# Patient Record
Sex: Male | Born: 1975
Health system: Southern US, Community
[De-identification: ages and names within clinical notes are randomized; demographics above are authoritative.]

---

## 1998-11-12 ENCOUNTER — Emergency Department (HOSPITAL_COMMUNITY): Admission: EM | Admit: 1998-11-12 | Discharge: 1998-11-12 | Payer: Self-pay | Admitting: Emergency Medicine

## 1998-11-19 ENCOUNTER — Emergency Department (HOSPITAL_COMMUNITY): Admission: EM | Admit: 1998-11-19 | Discharge: 1998-11-19 | Payer: Self-pay | Admitting: Emergency Medicine

## 2000-09-25 ENCOUNTER — Emergency Department (HOSPITAL_COMMUNITY): Admission: EM | Admit: 2000-09-25 | Discharge: 2000-09-25 | Payer: Self-pay

## 2000-12-16 ENCOUNTER — Emergency Department (HOSPITAL_COMMUNITY): Admission: EM | Admit: 2000-12-16 | Discharge: 2000-12-16 | Payer: Self-pay | Admitting: Emergency Medicine

## 2000-12-16 ENCOUNTER — Encounter: Payer: Self-pay | Admitting: Emergency Medicine

## 2001-01-08 ENCOUNTER — Encounter: Admission: RE | Admit: 2001-01-08 | Discharge: 2001-01-08 | Payer: Self-pay | Admitting: Sports Medicine

## 2001-02-26 ENCOUNTER — Encounter: Admission: RE | Admit: 2001-02-26 | Discharge: 2001-02-26 | Payer: Self-pay | Admitting: Family Medicine

## 2001-03-06 ENCOUNTER — Encounter: Admission: RE | Admit: 2001-03-06 | Discharge: 2001-03-06 | Payer: Self-pay | Admitting: Family Medicine

## 2001-03-07 ENCOUNTER — Encounter: Payer: Self-pay | Admitting: *Deleted

## 2001-03-07 ENCOUNTER — Encounter: Admission: RE | Admit: 2001-03-07 | Discharge: 2001-03-07 | Payer: Self-pay | Admitting: *Deleted

## 2001-03-09 ENCOUNTER — Encounter: Admission: RE | Admit: 2001-03-09 | Discharge: 2001-03-09 | Payer: Self-pay | Admitting: Sports Medicine

## 2002-06-03 ENCOUNTER — Encounter: Admission: RE | Admit: 2002-06-03 | Discharge: 2002-06-03 | Payer: Self-pay | Admitting: Family Medicine

## 2003-02-23 ENCOUNTER — Encounter: Payer: Self-pay | Admitting: Emergency Medicine

## 2003-02-23 ENCOUNTER — Emergency Department (HOSPITAL_COMMUNITY): Admission: AD | Admit: 2003-02-23 | Discharge: 2003-02-23 | Payer: Self-pay | Admitting: Emergency Medicine

## 2004-08-09 ENCOUNTER — Ambulatory Visit: Payer: Self-pay | Admitting: Sports Medicine

## 2004-08-09 ENCOUNTER — Ambulatory Visit (HOSPITAL_COMMUNITY): Admission: RE | Admit: 2004-08-09 | Discharge: 2004-08-09 | Payer: Self-pay | Admitting: Family Medicine

## 2006-07-24 ENCOUNTER — Ambulatory Visit: Payer: Self-pay | Admitting: Pulmonary Disease

## 2006-08-31 DIAGNOSIS — M928 Other specified juvenile osteochondrosis: Secondary | ICD-10-CM

## 2006-08-31 DIAGNOSIS — I471 Supraventricular tachycardia: Secondary | ICD-10-CM

## 2009-06-12 HISTORY — PX: VASECTOMY: SHX75

## 2009-07-04 HISTORY — PX: KNEE ARTHROSCOPY: SUR90

## 2009-07-27 ENCOUNTER — Encounter: Admission: RE | Admit: 2009-07-27 | Discharge: 2009-07-27 | Payer: Self-pay | Admitting: Family Medicine

## 2009-08-19 ENCOUNTER — Encounter: Admission: RE | Admit: 2009-08-19 | Discharge: 2009-08-19 | Payer: Self-pay | Admitting: Family Medicine

## 2010-07-14 ENCOUNTER — Encounter
Admission: RE | Admit: 2010-07-14 | Discharge: 2010-07-14 | Payer: Self-pay | Source: Home / Self Care | Attending: Family Medicine | Admitting: Family Medicine

## 2010-09-20 ENCOUNTER — Other Ambulatory Visit: Payer: Self-pay | Admitting: Family Medicine

## 2010-09-20 DIAGNOSIS — R634 Abnormal weight loss: Secondary | ICD-10-CM

## 2010-09-21 ENCOUNTER — Ambulatory Visit
Admission: RE | Admit: 2010-09-21 | Discharge: 2010-09-21 | Disposition: A | Discharge status: Home / Self Care | Payer: 59 | Source: Ambulatory Visit | Attending: Family Medicine | Admitting: Family Medicine

## 2010-09-21 DIAGNOSIS — R634 Abnormal weight loss: Secondary | ICD-10-CM

## 2010-09-21 MED ORDER — IOHEXOL 300 MG/ML  SOLN
100.0000 mL | Freq: Once | INTRAMUSCULAR | Status: AC | PRN
  Administered 2010-09-21: 100 mL via INTRAVENOUS

## 2010-09-26 ENCOUNTER — Emergency Department (HOSPITAL_COMMUNITY)
Admission: EM | Admit: 2010-09-26 | Discharge: 2010-09-27 | Disposition: A | Discharge status: Home / Self Care | Payer: 59 | Attending: Emergency Medicine | Admitting: Emergency Medicine

## 2010-09-26 ENCOUNTER — Emergency Department (HOSPITAL_COMMUNITY): Payer: 59

## 2010-09-26 DIAGNOSIS — R5381 Other malaise: Secondary | ICD-10-CM | POA: Insufficient documentation

## 2010-09-26 DIAGNOSIS — R918 Other nonspecific abnormal finding of lung field: Secondary | ICD-10-CM | POA: Insufficient documentation

## 2010-09-26 DIAGNOSIS — N390 Urinary tract infection, site not specified: Secondary | ICD-10-CM | POA: Insufficient documentation

## 2010-09-26 LAB — POCT I-STAT, CHEM 8
Glucose, Bld: 96 mg/dL (ref 70–99)
HCT: 38 % — ABNORMAL LOW (ref 39.0–52.0)
Hemoglobin: 12.9 g/dL — ABNORMAL LOW (ref 13.0–17.0)
Potassium: 4 mEq/L (ref 3.5–5.1)
Sodium: 137 mEq/L (ref 135–145)

## 2010-09-26 LAB — GLUCOSE, CAPILLARY: Glucose-Capillary: 88 mg/dL (ref 70–99)

## 2010-09-27 ENCOUNTER — Telehealth: Payer: Self-pay

## 2010-09-27 ENCOUNTER — Encounter (HOSPITAL_COMMUNITY): Payer: Self-pay

## 2010-09-27 ENCOUNTER — Emergency Department (HOSPITAL_COMMUNITY): Payer: 59

## 2010-09-27 ENCOUNTER — Telehealth: Payer: Self-pay | Admitting: Internal Medicine

## 2010-09-27 LAB — URINE MICROSCOPIC-ADD ON

## 2010-09-27 LAB — DIFFERENTIAL
Basophils Absolute: 0 10*3/uL (ref 0.0–0.1)
Basophils Relative: 1 % (ref 0–1)
Eosinophils Relative: 2 % (ref 0–5)
Lymphocytes Relative: 22 % (ref 12–46)
Neutro Abs: 4.8 10*3/uL (ref 1.7–7.7)

## 2010-09-27 LAB — CBC
HCT: 36.9 % — ABNORMAL LOW (ref 39.0–52.0)
RDW: 13.4 % (ref 11.5–15.5)
WBC: 7.4 10*3/uL (ref 4.0–10.5)

## 2010-09-27 LAB — URINALYSIS, ROUTINE W REFLEX MICROSCOPIC
Hgb urine dipstick: NEGATIVE
Nitrite: NEGATIVE
Specific Gravity, Urine: 1.009 (ref 1.005–1.030)
Urobilinogen, UA: 0.2 mg/dL (ref 0.0–1.0)

## 2010-09-27 MED ORDER — IOHEXOL 300 MG/ML  SOLN
100.0000 mL | Freq: Once | INTRAMUSCULAR | Status: AC | PRN
  Administered 2010-09-27: 100 mL via INTRAVENOUS

## 2010-09-27 NOTE — Telephone Encounter (Signed)
ERROR DUPLICATE MESSAGE.Vedia Coffer

## 2010-09-27 NOTE — Telephone Encounter (Signed)
Schedule pt to see MW on 10-14-10 for sarcoidosis. Carron Curie, CMA

## 2010-09-28 ENCOUNTER — Telehealth: Payer: Self-pay | Admitting: Pulmonary Disease

## 2010-09-28 NOTE — Telephone Encounter (Signed)
Called and spoke with Dr. Ermalene Searing office and informed her per td dr. Laury Axon to call and requests to speak to one of our physcians to get worked in since we don' have any openings.   Carver Fila, CMA

## 2010-09-29 ENCOUNTER — Encounter: Payer: Self-pay | Admitting: Pulmonary Disease

## 2010-09-29 ENCOUNTER — Other Ambulatory Visit: Payer: 59

## 2010-09-29 ENCOUNTER — Ambulatory Visit (INDEPENDENT_AMBULATORY_CARE_PROVIDER_SITE_OTHER): Payer: 59 | Admitting: Pulmonary Disease

## 2010-09-29 VITALS — BP 120/88 | HR 87 | Temp 98.5°F | Ht 70.0 in | Wt 147.4 lb

## 2010-09-29 DIAGNOSIS — R590 Localized enlarged lymph nodes: Secondary | ICD-10-CM

## 2010-09-29 DIAGNOSIS — R599 Enlarged lymph nodes, unspecified: Secondary | ICD-10-CM

## 2010-09-29 NOTE — Progress Notes (Signed)
  Subjective:    Patient ID: Ryan Horton, male    DOB: 1976/07/01, 35 y.o.   MRN: [id number]  HPI 35 year old male who presents with a chief complaint of weakness. for 1.5 months---nothing makes sx better or Worse,  Symptoms statrted with Ankle swelling in nov '11 Back pain - more steroids, nerve pill >> saw ortho >> aleve, took off steroids Then dry mouth in feb,c/o redness of eyes, loss of appetite, 'sick'- flu like weakness towards evening >> blood work s/o infection, ? wc high CT abdomen 3/20 >> neg GI appt made for bowel issue ER visit on 3/26 >> CXR showed bihilar fullness  CT chest showed >>Bilateral hilar and mediastinal adenopathy.  Slightly nodular appearing infiltrate identified in the left upper lobe, question atypical process such as fungal disease.  Given cipro for UTI Lost 16-20 lbs in 2 weeks Reviewed labs >> ANA neg, ESR 20, RA neg He was seen in jan'2008 for asthma like illness attributed to viral bronchitis - Spirometry nml then      Review of Systems  Constitutional: Positive for fever, appetite change and unexpected weight change.  HENT: Negative for ear pain, congestion, sore throat, rhinorrhea, sneezing, trouble swallowing, dental problem and postnasal drip.   Eyes: Positive for redness.  Respiratory: Positive for cough and shortness of breath. Negative for wheezing.   Cardiovascular: Positive for leg swelling. Negative for chest pain and palpitations.  Gastrointestinal: Positive for nausea and abdominal pain. Negative for vomiting and diarrhea.  Genitourinary: Positive for frequency. Negative for dysuria and urgency.  Musculoskeletal: Negative for joint swelling.  Skin: Negative for rash.  Neurological: Positive for headaches. Negative for syncope.  Hematological: Does not bruise/bleed easily.  Psychiatric/Behavioral: Negative for dysphoric mood. The patient is not nervous/anxious.        Objective:   Physical Exam Gen. Pleasant, well-nourished, in  no distress, normal affect ENT - no lesions, no post nasal drip Neck: No JVD, no thyromegaly, no carotid bruits Lungs: no use of accessory muscles, no dullness to percussion, clear without rales or rhonchi  Cardiovascular: Rhythm regular, heart sounds  normal, no murmurs or gallops, no peripheral edema Abdomen: soft and non-tender, no hepatosplenomegaly, BS normal. Musculoskeletal: No deformities, no cyanosis or clubbing Neuro:  alert, non focal         Assessment & Plan:

## 2010-09-29 NOTE — Patient Instructions (Signed)
We discussed possibilities of lymph node enlargement Bronchoscopy arranged for 3/30 Friday at 0800 at [facility] - go to admitting Nothing to eat after midnight  Blood work

## 2010-09-30 NOTE — Assessment & Plan Note (Signed)
Reactive vs other etiologies including sarcoidosis, lymphoma and metastatic disease - sarcoidosis would be a unifying diagnosis given some arthritis & redness of eyes. Also note micronodular parenchymal involvement on imaging The various options of biopsy including bronchoscopy,endobronchial US  guided needle aspiration and surgical biopsy were discussed.The risks of each procedure including coughing, bleeding and the  chances of lung puncture requiring chest tube were discussed in great detail. The benefits & alternatives including serial follow up were also discussed. We will proceed with bronchoscopy & transbronchial biopsies of LUL - the yield of 70-80% for sarcoid was discussed Obtain ACE level, If needed -will place PPD to establish baseline

## 2010-10-01 ENCOUNTER — Ambulatory Visit (HOSPITAL_COMMUNITY)
Admission: RE | Admit: 2010-10-01 | Discharge: 2010-10-01 | Disposition: A | Discharge status: Home / Self Care | Payer: 59 | Source: Ambulatory Visit | Attending: Pulmonary Disease | Admitting: Pulmonary Disease

## 2010-10-01 ENCOUNTER — Other Ambulatory Visit: Payer: Self-pay | Admitting: Pulmonary Disease

## 2010-10-01 DIAGNOSIS — R590 Localized enlarged lymph nodes: Secondary | ICD-10-CM

## 2010-10-01 DIAGNOSIS — R599 Enlarged lymph nodes, unspecified: Secondary | ICD-10-CM | POA: Insufficient documentation

## 2010-10-01 DIAGNOSIS — R918 Other nonspecific abnormal finding of lung field: Secondary | ICD-10-CM

## 2010-10-01 DIAGNOSIS — R634 Abnormal weight loss: Secondary | ICD-10-CM | POA: Insufficient documentation

## 2010-10-01 DIAGNOSIS — R000 Tachycardia, unspecified: Secondary | ICD-10-CM | POA: Insufficient documentation

## 2010-10-01 DIAGNOSIS — M129 Arthropathy, unspecified: Secondary | ICD-10-CM | POA: Insufficient documentation

## 2010-10-05 ENCOUNTER — Encounter: Payer: Self-pay | Admitting: Pulmonary Disease

## 2010-10-07 ENCOUNTER — Encounter: Payer: Self-pay | Admitting: Pulmonary Disease

## 2010-10-07 ENCOUNTER — Ambulatory Visit (INDEPENDENT_AMBULATORY_CARE_PROVIDER_SITE_OTHER): Payer: 59 | Admitting: Pulmonary Disease

## 2010-10-07 VITALS — BP 128/80 | HR 92 | Temp 98.1°F | Ht 69.0 in | Wt 147.2 lb

## 2010-10-07 DIAGNOSIS — D869 Sarcoidosis, unspecified: Secondary | ICD-10-CM

## 2010-10-07 LAB — LEGIONELLA PROFILE(CULTURE+DFA/SMEAR): Legionella Antigen (DFA): NEGATIVE

## 2010-10-07 MED ORDER — PREDNISONE 10 MG PO TABS: 20.0000 mg | ORAL_TABLET | Freq: Every day | ORAL | Status: DC

## 2010-10-07 NOTE — Progress Notes (Signed)
  Subjective:    Patient ID: ARMONI DEPASS, male    DOB: [birth year], 35 y.o.   MRN: [id number]  HPI 35 year old male who presents with a chief complaint of weakness. for 1.5 months---nothing makes sx better or Worse,  Symptoms statrted with Ankle swelling in nov '11 Back pain - more steroids, nerve pill >> saw ortho >> aleve, took off steroids Then dry mouth in feb,c/o redness of eyes, loss of appetite, 'sick'- flu like weakness towards evening >> blood work s/o infection, ? wc high CT abdomen 3/20 >> neg GI appt made for bowel issue ER visit on 3/26 >> CXR showed bihilar fullness  CT chest showed >>Bilateral hilar and mediastinal adenopathy.  Slightly nodular appearing infiltrate identified in the left upper lobe, question atypical process such as fungal disease.  Given cipro for UTI Lost 16-20 lbs in 2 weeks Reviewed labs >> ANA neg, ESR 20, RA neg He was seen in jan'2008 for asthma like illness attributed to viral bronchitis - Spirometry nml then    Review of Systems     Objective:   Physical Exam        Assessment & Plan:

## 2010-10-07 NOTE — Assessment & Plan Note (Signed)
NOn caseating granulomas in the setting of brief arthritis,  mediastinal LNs, micronodular LUL infiltrate & granular appearance of airways is diagnostic of sarcoidosis Eye exam Baseline EKG -ok Ca level ok Start on 20 mg prednisone x 4 weeks , then slow taper over 3-6 months to lowest effective dose or off. Baseline ACE level

## 2010-10-07 NOTE — Patient Instructions (Addendum)
EKG today ACE level Eye exam Start prednisone 20 mg - we discussed side effects Full pFTs

## 2010-10-14 ENCOUNTER — Institutional Professional Consult (permissible substitution): Payer: 59 | Admitting: Internal Medicine

## 2010-10-18 ENCOUNTER — Ambulatory Visit (INDEPENDENT_AMBULATORY_CARE_PROVIDER_SITE_OTHER): Payer: 59 | Admitting: Pulmonary Disease

## 2010-10-18 DIAGNOSIS — D869 Sarcoidosis, unspecified: Secondary | ICD-10-CM

## 2010-10-18 LAB — PULMONARY FUNCTION TEST

## 2010-10-18 NOTE — Progress Notes (Signed)
PFT done today. 

## 2010-10-18 NOTE — Op Note (Signed)
  [name], Ryan Horton              ACCOUNT NO.:  [id number]  MEDICAL RECORD NO.:  [id number]           PATIENT TYPE:  O  LOCATION:  RESP                         FACILITY:  [facility]  PHYSICIAN:  Oretha Milch, MD      DATE OF BIRTH:  07-16-1975  DATE OF PROCEDURE:  10/01/2010 DATE OF DISCHARGE:                              OPERATIVE REPORT   PROCEDURE PERFORMED:  Video bronchoscopy with biopsies.  INDICATIONS FOR PROCEDURE:  Mediastinal lymphadenopathy with left upper lobe micronodular infiltrate in this 36 year old nonsmoker with preceding arthritis, weight loss, very suggestive of sarcoidosis. Written informed consent was obtained from the patient prior to procedure.  Risks of the procedure including coughing, bleeding, and a small chance of lung puncture requiring a chest tube were discussed in detail and he evidenced understanding.  DESCRIPTION OF PROCEDURE:  Versed 6 mg and 150 mcg of fentanyl were used in divided doses over a span of 20-30 minutes during the procedure. Bronchoscope was inserted from the right naris.  Upper airway appeared normal.  Vocal cords showed normal appearance and motion.  The tracheobronchial tree was then examined.  At the subsegmental level, the mucosa appeared nodular (see pictures).  No endobronchial lesions were noted.  Minimal phlegm was noted in the right lower lobe.  Attention was then turned to the left upper lobe.  Bronchoalveolar lavage was obtained from various subsegments of the left upper lobe with moderate amount of coughing.  Transbronchial biopsies x3 to 4 were obtained from apical posterior and anterior subsegments of the left upper lobe.  There was bleeding with blood loss of about 5-10 cc in the last biopsies. Hemostasis was achieved by pressure with the tip of the scope and suctioning was carried out until no further bleeding was noted.  The patient tolerated the procedure well except for tachycardia 130s. Bronchoscope was then  withdrawn.  He was awake right after procedure. Chest x-ray will be performed to rule out presence of pneumothorax.     Oretha Milch, MD     RVA/MEDQ  D:  10/01/2010  T:  10/01/2010  Job:  [id number]  Electronically Signed by Cyril Mourning MD on 10/18/2010 08:37:29 AM

## 2010-10-19 ENCOUNTER — Encounter: Payer: Self-pay | Admitting: Pulmonary Disease

## 2010-10-25 ENCOUNTER — Telehealth: Payer: Self-pay | Admitting: Pulmonary Disease

## 2010-10-25 NOTE — Telephone Encounter (Signed)
Let him know PFTs show mild decrease in lung capacity Hope he is doing well on prednisone

## 2010-10-26 ENCOUNTER — Institutional Professional Consult (permissible substitution): Payer: 59 | Admitting: Pulmonary Disease

## 2010-10-27 ENCOUNTER — Ambulatory Visit (INDEPENDENT_AMBULATORY_CARE_PROVIDER_SITE_OTHER): Payer: 59 | Admitting: Internal Medicine

## 2010-10-27 ENCOUNTER — Encounter: Payer: Self-pay | Admitting: Internal Medicine

## 2010-10-27 ENCOUNTER — Other Ambulatory Visit (INDEPENDENT_AMBULATORY_CARE_PROVIDER_SITE_OTHER): Payer: 59

## 2010-10-27 ENCOUNTER — Other Ambulatory Visit (INDEPENDENT_AMBULATORY_CARE_PROVIDER_SITE_OTHER): Payer: 59 | Admitting: Internal Medicine

## 2010-10-27 ENCOUNTER — Encounter: Payer: Self-pay | Admitting: Pulmonary Disease

## 2010-10-27 VITALS — BP 122/88 | HR 88 | Temp 98.5°F | Resp 16 | Ht 69.0 in | Wt 150.0 lb

## 2010-10-27 DIAGNOSIS — Z13220 Encounter for screening for lipoid disorders: Secondary | ICD-10-CM

## 2010-10-27 DIAGNOSIS — D649 Anemia, unspecified: Secondary | ICD-10-CM

## 2010-10-27 DIAGNOSIS — Z0000 Encounter for general adult medical examination without abnormal findings: Secondary | ICD-10-CM | POA: Insufficient documentation

## 2010-10-27 DIAGNOSIS — D869 Sarcoidosis, unspecified: Secondary | ICD-10-CM

## 2010-10-27 LAB — CBC WITH DIFFERENTIAL/PLATELET
Basophils Relative: 0.1 % (ref 0.0–3.0)
Eosinophils Absolute: 0 10*3/uL (ref 0.0–0.7)
Eosinophils Relative: 0.1 % (ref 0.0–5.0)
Hemoglobin: 13.1 g/dL (ref 13.0–17.0)
Lymphocytes Relative: 6.8 % — ABNORMAL LOW (ref 12.0–46.0)
MCHC: 33.3 g/dL (ref 30.0–36.0)
MCV: 84.7 fl (ref 78.0–100.0)
Monocytes Absolute: 1 10*3/uL (ref 0.1–1.0)
Neutro Abs: 7.3 10*3/uL (ref 1.4–7.7)
RBC: 4.66 Mil/uL (ref 4.22–5.81)
WBC: 9 10*3/uL (ref 4.5–10.5)

## 2010-10-27 NOTE — Progress Notes (Signed)
Subjective:    Patient ID: Ryan Horton, male    DOB: 1975/08/26, 35 y.o.   MRN: [id number]  Anemia Presents for follow-up visit. There has been no abdominal pain, anorexia, bruising/bleeding easily, confusion, fever, leg swelling, light-headedness, malaise/fatigue, pallor, palpitations, paresthesias, pica or weight loss. Signs of blood loss that are not present include hematemesis, hematochezia, melena, menorrhagia and vaginal bleeding. There are no compliance problems.       Review of Systems  Constitutional: Negative for fever, chills, weight loss, malaise/fatigue, diaphoresis, activity change, appetite change, fatigue and unexpected weight change.  HENT: Negative for facial swelling, neck pain and neck stiffness.   Respiratory: Negative for apnea, cough, choking, chest tightness, shortness of breath, wheezing and stridor.   Cardiovascular: Negative for chest pain, palpitations and leg swelling.  Gastrointestinal: Negative for nausea, vomiting, abdominal pain, diarrhea, constipation, blood in stool, melena, hematochezia, abdominal distention, anal bleeding, rectal pain, anorexia and hematemesis.  Genitourinary: Negative for dysuria, urgency, hematuria, flank pain, decreased urine volume, scrotal swelling, vaginal bleeding, enuresis, testicular pain and menorrhagia.  Musculoskeletal: Negative for myalgias, back pain, joint swelling, arthralgias and gait problem.  Skin: Negative for pallor and rash.  Neurological: Negative for dizziness, tremors, seizures, syncope, facial asymmetry, speech difficulty, weakness, light-headedness, numbness, headaches and paresthesias.  Hematological: Negative for adenopathy. Does not bruise/bleed easily.  Psychiatric/Behavioral: Negative for behavioral problems, confusion, dysphoric mood, decreased concentration and agitation. The patient is not nervous/anxious.        Objective:   Physical Exam  [vitalsreviewed. Constitutional: He is oriented to  person, place, and time. He appears well-developed and well-nourished. No distress.  HENT:  Head: Normocephalic and atraumatic.  Right Ear: External ear normal.  Left Ear: External ear normal.  Nose: Nose normal.  Mouth/Throat: Oropharynx is clear and moist. No oropharyngeal exudate.  Eyes: Conjunctivae and EOM are normal. Pupils are equal, round, and reactive to light. Right eye exhibits no discharge. Left eye exhibits no discharge. No scleral icterus.  Neck: Normal range of motion. Neck supple. No JVD present. No tracheal deviation present. No thyromegaly present.  Cardiovascular: Normal rate, regular rhythm, normal heart sounds and intact distal pulses.  Exam reveals no gallop and no friction rub.   No murmur heard. Pulmonary/Chest: Effort normal and breath sounds normal. No stridor. No respiratory distress. He has no wheezes. He has no rales. He exhibits no tenderness.  Abdominal: Soft. Bowel sounds are normal. He exhibits no distension and no mass. There is no tenderness. There is no rebound and no guarding. Hernia confirmed negative in the right inguinal area and confirmed negative in the left inguinal area.  Genitourinary: Rectum normal, testes normal and penis normal. Rectal exam shows no external hemorrhoid, no internal hemorrhoid, no fissure, no mass, no tenderness and anal tone normal. Guaiac negative stool. Right testis shows no mass, no swelling and no tenderness. Left testis shows no mass, no swelling and no tenderness. Uncircumcised. No phimosis, paraphimosis, hypospadias, penile erythema or penile tenderness. No discharge found.  Musculoskeletal: Normal range of motion. He exhibits no edema and no tenderness.  Lymphadenopathy:    He has no cervical adenopathy.       Right: No inguinal adenopathy present.       Left: No inguinal adenopathy present.  Neurological: He is alert and oriented to person, place, and time. He has normal reflexes. He displays normal reflexes. No cranial  nerve deficit. He exhibits normal muscle tone. Coordination normal.  Skin: Skin is warm and dry. No rash noted. He  is not diaphoretic. No erythema. No pallor.  Psychiatric: He has a normal mood and affect. His behavior is normal. Judgment and thought content normal.        Lab Results  Component Value Date   WBC 7.4 09/27/2010   HGB 12.0* 09/27/2010   HCT 36.9* 09/27/2010   PLT 274 09/27/2010   NA 137 09/26/2010   K 4.0 09/26/2010   CL 101 09/26/2010   CREATININE 1.3 09/26/2010   BUN 17 09/26/2010    Assessment & Plan:

## 2010-10-27 NOTE — Assessment & Plan Note (Signed)
Pt ed material given, labs ordered, will follow

## 2010-10-27 NOTE — Assessment & Plan Note (Signed)
I think the anemia is due to chronic disease but will check for iron and B12 deficiency

## 2010-10-27 NOTE — Patient Instructions (Signed)
Anemia - Nonspecific Your exam and blood tests show you are anemic. This means your blood (hemoglobin) level is low. Normal hemoglobin values are 12-15 for females and 14-17 for males. Make a note of your hemoglobin level today. The hematocrit percent is also used to measure anemia. A normal hematocrit is 38-46 in females and 42-49 in males. Make a note of your hematocrit level today. SYMPTOMS Anemia can come on suddenly (acute). It can also come on slowly (chronic). Symptoms can include:  Minor weakness.   Dizziness.   Palpitations.  Shortness of breath.   Symptoms may be absent until half your hemoglobin is missing if it comes on slowly. Anemia due to acute blood loss from an injury or internal bleeding may require blood transfusion if the loss is severe. Hospital care is needed if you are anemic and there is significant continued blood loss. CAUSES Anemia can be due to many different causes.  Excessive bleeding from periods is a common problem in women.  Other causes can include:  Intestinal bleeding.   Poor nutrition.   Kidney, thyroid, liver, and bone marrow diseases.  TREATMENT  Stool tests for blood (Hemoccult) and additional lab tests are often needed. This determines the best treatment.   Further checking on your condition and your response to treatment is very important. It often takes many weeks to correct anemia.  Depending on the cause, treatment can include:  Supplements of iron.   Vitamins B12 and folic acid.   Hormone medicines.  If your anemia is due to bleeding, finding the cause of the blood loss is very important. This will help avoid further problems. SEEK IMMEDIATE MEDICAL CARE IF:  You develop fainting, extreme weakness, shortness of breath, or chest pain.   You develop heavy vaginal bleeding.   You develop bloody or black, tarry stools or vomit up blood.   You develop a high fever, rash, repeated vomiting, or dehydration.  Document Released:  07/28/2004 Document Re-Released: 12/08/2009 [facility] Patient Information 2011 [place], [place].Health Maintenance in Males MAINTAIN REGULAR HEALTH EXAMS  Maintain a healthy diet and normal weight. Increased weight leads to problems with blood pressure and diabetes. Decrease fat in the diet and increase exercise. Obtain a proper diet from your caregiver if necessary.   High blood pressure causes heart and blood vessel problems. Check blood pressures regularly and keep your blood pressure at normal limits. Aerobic exercise helps this. Persistent elevations of blood pressure should be treated with medications if weight loss and exercise are ineffective.   Avoid smoking, drinking in excess (more than 2 drinks per day), or use of street drugs. Do not share needles with anyone. Ask for help if you need assistance or instructions on stopping the use of alcohol, cigarettes, or drugs.   Maintain normal blood lipids and cholesterol. Your caregiver can give you information to lower your risk of heart disease or stroke.   Ask your caregiver if you are in need of early heart disease screening because of a strong family history of heart disease or signs of elevated testosterone (male sex hormone) levels. These can predispose you to early heart disease.   Practice safe sex. Practicing safe sex decreases your risk for a sexually transmitted infection (STI). Some of the STIs are gonorrhea, chlamydia, syphilis, trichimonas, herpes, human papillomavirus (HPV), and human immunodeficiency virus (HIV). Herpes, HIV, and HPV are viral illnesses that have no cure. These can result in disability, cancer, and death.   It is not safe for someone who has  AIDS or is HIV positive to have unprotected sex with a partner who is HIV positive. The reason for this is the fact that there are many different strains of HIV. If you have a strain that is readily treated with medications and then suddenly introduce a strain from a partner that  has no further treatment options, you may suddenly have a strain of HIV that is untreatable. Even if you are both positive for HIV, it is still necessary to practice safe sex.   Use sunscreen with a SPF of 15 or greater. Being outside in the sun when your shadow caused by the sun is shorter than you are, means you are being exposed to sun at greater intensity. Lighter skinned people are at a greater risk of skin cancer.   Keep carbon monoxide and smoke detectors in your home and functioning at all times. Change the batteries every 6 months.   Do monthly examinations of your testicles. The best time to do this is after a hot shower or bath when the tissues are loose. Notify your caregivers of any lumps, tenderness, or changes in size or shape.   Notify your caregiver of new moles or changes in moles, especially if there is a change in shape or color. Also notify your caregiver if a mole is larger than the size of a pencil eraser.   Stay current with your tetanus shots and other required immunizations.  The Body Mass Index (BMI) is a way of measuring how much of your body is fat. Having a BMI above 27 increases the risk of heart disease, diabetes, hypertension, stroke, and other problems related to obesity. Document Released: 12/17/2007 Document Re-Released: 12/08/2009 [facility] Patient Information 2011 [place], [place].

## 2010-10-28 ENCOUNTER — Encounter: Payer: Self-pay | Admitting: Internal Medicine

## 2010-10-28 LAB — FUNGUS CULTURE W SMEAR: Fungal Smear: NONE SEEN

## 2010-10-28 LAB — LIPID PANEL
Total CHOL/HDL Ratio: 3
Triglycerides: 40 mg/dL (ref 0.0–149.0)
VLDL: 8 mg/dL (ref 0.0–40.0)

## 2010-10-28 LAB — IBC PANEL: Iron: 42 ug/dL (ref 42–165)

## 2010-10-28 NOTE — Telephone Encounter (Signed)
Spoke w/ pt and advised him of this. Pt verbalized understanding and nothing further was needed

## 2010-10-28 NOTE — Telephone Encounter (Signed)
Pt informed of RA's recs. Pt verbalized understanding. Wants to know what can he do to increase his lung capacity. Also wants to know if the decrease is due to the Sarcoidosis. Dr. Vassie Loll please advise. Thanks.

## 2010-10-28 NOTE — Telephone Encounter (Signed)
Yes, sarcoid causes decrease in lung capacity. Keep up with exercise

## 2010-10-30 LAB — METHYLMALONIC ACID, SERUM: Methylmalonic Acid, Quantitative: 102 nmol/L (ref 87–318)

## 2010-11-03 ENCOUNTER — Encounter: Payer: Self-pay | Admitting: Pulmonary Disease

## 2010-11-05 ENCOUNTER — Ambulatory Visit (INDEPENDENT_AMBULATORY_CARE_PROVIDER_SITE_OTHER): Payer: 59 | Admitting: Pulmonary Disease

## 2010-11-05 VITALS — BP 140/86 | HR 96 | Temp 98.5°F | Ht 69.0 in | Wt 151.0 lb

## 2010-11-05 DIAGNOSIS — Z92241 Personal history of systemic steroid therapy: Secondary | ICD-10-CM

## 2010-11-05 DIAGNOSIS — D869 Sarcoidosis, unspecified: Secondary | ICD-10-CM

## 2010-11-05 MED ORDER — PREDNISONE 5 MG PO TABS: 5.0000 mg | ORAL_TABLET | Freq: Every day | ORAL | Status: AC

## 2010-11-05 MED ORDER — PREDNISONE 10 MG PO TABS: 20.0000 mg | ORAL_TABLET | Freq: Every day | ORAL | Status: AC

## 2010-11-05 NOTE — Patient Instructions (Signed)
Decrease to 15 mg prednisone on may 15th Decrease to 10 mg on June 15 th Resume all activity

## 2010-11-05 NOTE — Progress Notes (Signed)
  Subjective:    Patient ID: Ryan Horton, male    DOB: 05-24-1976, 35 y.o.   MRN: [id number]  HPI 35 year old male with sarcoidosis.  He presented 3/12 with weakness. for 1.5 months,ankle swelling in nov '11 , wt loss, Back pain - more steroids, nerve pill >> saw ortho >> aleve, took off steroids  Then dry mouth in feb,c/o redness of eyes, loss of appetite, 'sick'- flu like weakness towards evening >> blood work s/o infection, ? wc high  CT abdomen 3/20 >> neg GI appt made for bowel issue  ER visit on 3/26 >> CXR showed bihilar fullness  CT chest showed >>Bilateral hilar and mediastinal adenopathy.  Slightly nodular appearing infiltrate identified in the left upper lobe, question atypical process such as fungal disease.  Given cipro for UTI  Reviewed labs >> ANA neg, ESR 20, RA neg  He was seen in jan'2008 for asthma like illness attributed to viral bronchitis - Spirometry nml then Eye exam  Baseline EKG -ok  Ca level ok  He was started on 20 mg prednisone x 4 weeks , then slow taper over 3-6 months to lowest effective dose or off.   11/05/2010 PFTs >> mild restriction FVC & TLC 74%, DLCO 82 % preserved Systemic symptoms better, but c/o dyspnea       Review of Systems Pt denies any significant  nasal congestion or excess secretions, fever, chills, sweats, unintended wt loss, pleuritic or exertional cp, orthopnea pnd or leg swelling.  Pt also denies any obvious fluctuation in symptoms with weather or environmental change or other alleviating or aggravating factors.    Pt denies any increase in rescue therapy over baseline, denies waking up needing it or having early am exacerbations or coughing/wheezing/ or dyspnea      Objective:   Physical Exam Gen. Pleasant, well-nourished, in no distress ENT - no lesions, no post nasal drip Neck: No JVD, no thyromegaly, no carotid bruits Lungs: no use of accessory muscles, no dullness to percussion, clear without rales or rhonchi    Cardiovascular: Rhythm regular, heart sounds  normal, no murmurs or gallops, no peripheral edema Musculoskeletal: No deformities, no cyanosis or clubbing         Assessment & Plan:

## 2010-11-06 ENCOUNTER — Encounter: Payer: Self-pay | Admitting: Pulmonary Disease

## 2010-11-06 NOTE — Assessment & Plan Note (Signed)
Plan would be to lower prednisone to lowest effective dose or completely off in 3-6 months Taper outlined No side effects so far.

## 2010-11-14 LAB — AFB CULTURE WITH SMEAR (NOT AT ARMC)

## 2010-11-19 NOTE — Assessment & Plan Note (Signed)
[facility]                             PULMONARY OFFICE NOTE   NAME:Ryan Horton, Ryan Horton                    MRN:          [id number]  DATE:07/24/2006                            DOB:          02-02-1976    HISTORY OF PRESENT ILLNESS:  The patient is a 35 year old gentleman whom  I have been asked to see for persistent dyspnea and pulmonary symptoms.  The patient states approximately two months ago he began to have  decreasing exercise tolerance as well as cough and congestion with  rattling and purulent mucus.  He was treated with antibiotics and  decongestants and given a rescue inhaler with albuterol.  The patient  felt better, but never felt that he got back to his usual baseline.  Approximately two weeks ago he began to have increasing shortness of  breath climbing the stairs and used his albuterol and stated that it  helped him significantly.  The patient has had a chest x-ray with no  significant abnormalities via report.  I have not seen the films.  The  patient was ultimately placed on Symbicort 160/4.5 2 puffs two times a  day and feels that it has really helped him.  He is trying to get back  to his usual exercise regimen.  Currently the patient has no cough or  mucus and has no exercise limitation.  The patient did have PFTs done at  his primary care doctor's office on July 12, 2006 which were totally  within normal limits.   PAST MEDICAL HISTORY:  Totally unremarkable.   CURRENT MEDICATIONS:  None.   ALLERGIES:  The patient has no known drug allergies.   SOCIAL HISTORY:  He is married and has children.  He works as a Physicist, medical.  He has a history of rare tobacco use, but has not smoked since  2005.   FAMILY HISTORY:  Noncontributory in first degree relatives.   REVIEW OF SYSTEMS:  As per History of Present Illness.  Also see patient  intake form documented in the chart.   PHYSICAL EXAMINATION:  GENERAL:  He is a  well-developed male in no acute  distress.  VITAL SIGNS:  Blood pressure 110/70, pulse 74, temperature 98, weight  163 pounds, 5 feet 9-1/2 inches tall, O2 saturation on room air is 99%.  HEENT:  Pupils equal, round, reactive to light and accommodation.  Extraocular muscles are intact.  Nares are patent at discharge.  Oropharynx is clear.  NECK:  Supple without JVD or lymphadenopathy.  There is no palpable  thyromegaly.  CHEST:  Totally clear to auscultation.  CARDIAC:  Reveals regular rate and rhythm.  No murmurs, rubs or gallops.  ABDOMEN:  Soft, nontender with good bowel sounds.  GENITAL, RECTAL, BREASTS:  Genital exam, rectal exam, breast exam were  not done and not indicated.  EXTREMITIES:  Lower extremities are without edema.  Pulses are intact  distally.  NEUROLOGICAL:  Alert and oriented with no obvious motor deficits.   LABORATORY DATA:  Spirometry today is totally within normal limits.   IMPRESSION:  Probable acute post viral bronchiolitis after  his recent  episode two months ago of acute bronchitis of unknown etiology.  More  than likely he had ongoing airway inflammation associated with this  which has greatly improved with various treatments including his  Symbicort.  I certainly cannot totally exclude the possibility of the  patient having asthma, but I think a post viral bronchiolitis makes more  sense clinically.  At this point in time I would like to put him on a  lower strength of Symbicort for approximately two weeks and then  discontinue the medication.  Would then like to have the inhaled steroid  to wash out of his system over the next six weeks and have him come back  and have spirometry done to see if he needs the medicine chronically or  was this just a one time bronchiolitis that is now resolved.     Barbaraann Share, MD,FCCP  Electronically Signed    [facility]  DD: 10/18/2006  DT: 10/18/2006  Job #: [id number]   cc:   Tanya D. Daphine Deutscher, M.D.

## 2010-12-06 ENCOUNTER — Telehealth: Payer: Self-pay | Admitting: Pulmonary Disease

## 2010-12-06 NOTE — Telephone Encounter (Signed)
OK to increase to 20 mg . Do not take this on an empty stomach

## 2010-12-06 NOTE — Telephone Encounter (Signed)
Spoke with pt and he states x 1.5 weeks every morning he has been having nausea. He states he just overall does not feel well. He states he did not feel this way when he was on prednisone 20mg . He states the only thing that makes him feel better is eating a meal. 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 Hungry but this is the only thing that relieves the nausea feeling. Pt denies any other complaints, no chest tightness, SOB. . Pt wants to know if he can increase pred back to 20mg  daily to see if feeling goes away. He has been on Prednisone 15mg  daily since May 15th.  Please advise. Carron Curie, CMA

## 2010-12-07 NOTE — Telephone Encounter (Signed)
Pt states he started back on the Pred 20mg  this morning. He describes the feeling as a "yucky" feeling. Nausea x 20. Has been eating breakfast first and then taking the Pred 15mg  with large amounts of water. I informed pt of RA's findings and recommendations. Pt verbalized understanding and will call back in 3 to 5 days with a call report.

## 2010-12-16 ENCOUNTER — Telehealth: Payer: Self-pay | Admitting: Internal Medicine

## 2010-12-16 NOTE — Telephone Encounter (Signed)
Forwarded to Dr. Jones for review. °

## 2011-01-24 ENCOUNTER — Telehealth: Payer: Self-pay | Admitting: Pulmonary Disease

## 2011-01-24 MED ORDER — PREDNISONE 10 MG PO TABS: 10.0000 mg | ORAL_TABLET | Freq: Every day | ORAL | Status: AC

## 2011-01-24 NOTE — Telephone Encounter (Signed)
Refill sent. Pt aware.Jennifer Castillo, CMA  

## 2011-02-28 ENCOUNTER — Telehealth: Payer: Self-pay | Admitting: Pulmonary Disease

## 2011-02-28 MED ORDER — PREDNISONE 10 MG PO TABS: 10.0000 mg | ORAL_TABLET | Freq: Every day | ORAL | Status: AC

## 2011-02-28 NOTE — Telephone Encounter (Signed)
Spoke with pt wife and advised the pt needs an appt for refills, so appt set for 9/28-12 at 9am. Also the direction were for the pt to taper down prednisone, but wife states when he did he began to have breathing issues so he went back up to 20mg  daily. Refill sent to last until appt. Carron Curie, CMA

## 2011-02-28 NOTE — Telephone Encounter (Signed)
[id number], pt needs appt that is why on 1 month was given at last refill. Also need to clarify what mg the pt is on.  Carron Curie, CMA

## 2011-04-01 ENCOUNTER — Ambulatory Visit (INDEPENDENT_AMBULATORY_CARE_PROVIDER_SITE_OTHER)
Admission: RE | Admit: 2011-04-01 | Discharge: 2011-04-01 | Disposition: A | Discharge status: Home / Self Care | Payer: 59 | Source: Ambulatory Visit | Attending: Internal Medicine | Admitting: Internal Medicine

## 2011-04-01 ENCOUNTER — Encounter: Payer: Self-pay | Admitting: Pulmonary Disease

## 2011-04-01 ENCOUNTER — Ambulatory Visit (INDEPENDENT_AMBULATORY_CARE_PROVIDER_SITE_OTHER): Payer: 59 | Admitting: Pulmonary Disease

## 2011-04-01 VITALS — BP 120/84 | HR 75 | Ht 69.0 in | Wt 158.0 lb

## 2011-04-01 DIAGNOSIS — D869 Sarcoidosis, unspecified: Secondary | ICD-10-CM

## 2011-04-01 DIAGNOSIS — Z23 Encounter for immunization: Secondary | ICD-10-CM

## 2011-04-01 MED ORDER — PREDNISONE 5 MG PO TABS: ORAL_TABLET | ORAL | Status: DC

## 2011-04-01 NOTE — Progress Notes (Signed)
Addended by: Alecia Lemming on: 04/01/2011 09:57 AM   Modules accepted: Orders

## 2011-04-01 NOTE — Progress Notes (Signed)
  Subjective:    Patient ID: Ryan Horton, male    DOB: 1975/11/18, 35 y.o.   MRN: [id number]  HPI 35 year old male with sarcoidosis. He presented 3/12 with weakness. for 1.5 months,ankle swelling in nov '11 , wt loss, Back pain - more steroids, nerve pill >> saw ortho >> aleve, took off steroids  Then dry mouth in feb,c/o redness of eyes, loss of appetite, 'sick'- flu like weakness towards evening >> blood work s/o infection, ? wc high  CT abdomen 3/20 >> neg   3/26 >> CXR showed bihilar fullness  CT chest showed >>Bilateral hilar and mediastinal adenopathy. Slightly nodular appearing infiltrate identified in the left upper lobe.Given cipro for UTI  Reviewed labs >> ANA neg, ESR 20, RA neg  He was seen in jan'2008 for asthma like illness attributed to viral bronchitis - Spirometry nml then Eye exam  Baseline EKG -ok  Ca level ok  He was started on 20 mg prednisone x 4 weeks , then slow taper over 3-6 months to lowest effective dose or off.   11/05/2010 PFTs >> mild restriction FVC & TLC 74%, DLCO 82 % preserved Systemic symptoms better, but c/o dyspnea  04/01/2011 direction were for the pt to taper down prednisone, but when he got down to 10 mg he began to have breathing issues as prior so he went back up to 20mg  daily Breathing back to nml now, no skin lesions Appetite ok,     Review of Systems Patient denies significant dyspnea,cough, hemoptysis,  chest pain, palpitations, pedal edema, orthopnea, paroxysmal nocturnal dyspnea, lightheadedness, nausea, vomiting, abdominal or  leg pains      Objective:   Physical Exam Gen. Pleasant, well-nourished, in no distress ENT - no lesions, no post nasal drip Neck: No JVD, no thyromegaly, no carotid bruits Lungs: no use of accessory muscles, no dullness to percussion, clear without rales or rhonchi  Cardiovascular: Rhythm regular, heart sounds  normal, no murmurs or gallops, no peripheral edema Musculoskeletal: No deformities, no  cyanosis or clubbing         Assessment & Plan:

## 2011-04-01 NOTE — Assessment & Plan Note (Signed)
Lower to 15 g pred CXR today Goal will be to decrease to lowest effective dose - taper by 2.5 mg from then on every 3-4 weeks

## 2011-04-01 NOTE — Patient Instructions (Signed)
Chest xray today Flu shot Decrease to 15 mg prednisone until next visit - will decrease gradually after to lowest effective dose

## 2011-04-05 NOTE — Progress Notes (Signed)
Quick Note:  I informed pt of RA's findings and recommendations. Pt verbalized understanding. Julaine Hua, CMA  ______

## 2011-05-17 ENCOUNTER — Encounter: Payer: Self-pay | Admitting: Pulmonary Disease

## 2011-05-17 ENCOUNTER — Ambulatory Visit (INDEPENDENT_AMBULATORY_CARE_PROVIDER_SITE_OTHER): Payer: 59 | Admitting: Pulmonary Disease

## 2011-05-17 VITALS — BP 112/72 | HR 82 | Temp 97.8°F | Ht 69.0 in | Wt 158.0 lb

## 2011-05-17 DIAGNOSIS — D869 Sarcoidosis, unspecified: Secondary | ICD-10-CM

## 2011-05-17 MED ORDER — PREDNISONE 5 MG PO TABS: ORAL_TABLET | ORAL | Status: DC

## 2011-05-17 MED ORDER — PREDNISONE 2.5 MG PO TABS: ORAL_TABLET | ORAL | Status: DC

## 2011-05-17 NOTE — Progress Notes (Signed)
  Subjective:    Patient ID: Ryan Horton, male    DOB: 04/17/1976, 35 y.o.   MRN: [id number]  HPI 35 year old male with sarcoidosis.  Presented 3/12 with weakness. for 1.5 months,ankle swelling in nov '11, redness of eyes, loss of appetite, 'sick'- flu like weakness towards evening >> blood work s/o infection, ? wc high  CT abdomen >> neg, CXR showed bihilar fullness  CT chest showed >>Bilateral hilar and mediastinal adenopathy. Slightly nodular appearing infiltrate identified in the left upper lobe.Given cipro for UTI  Labs >> ANA neg, ESR 20, RA neg , ACE 73 He was seen in jan'2008 for asthma like illness attributed to viral bronchitis - Spirometry nml then  Eye exam, baseline EKG -ok, Ca level ok  Started on 20 mg prednisone  PFTs >> mild restriction FVC & TLC 74%, DLCO 82 %   04/01/2011  When down to 10 mg he began to have breathing issues as prior so he went back up to 20mg  daily  Breathing back to nml , no skin lesions >> dropped to 15 mg   05/17/2011 Feels well on 15 mg, no skin lesions, no dyspnea, cough , able to exercise , some fatigue +   Review of Systems Patient denies significant dyspnea,cough, hemoptysis,  chest pain, palpitations, pedal edema, orthopnea, paroxysmal nocturnal dyspnea, lightheadedness, nausea, vomiting, abdominal or  leg pains      Objective:   Physical Exam  Gen. Pleasant, well-nourished, in no distress ENT - no lesions, no post nasal drip Neck: No JVD, no thyromegaly, no carotid bruits Lungs: no use of accessory muscles, no dullness to percussion, clear without rales or rhonchi  Cardiovascular: Rhythm regular, heart sounds  normal, no murmurs or gallops, no peripheral edema Musculoskeletal: No deformities, no cyanosis or clubbing         Assessment & Plan:

## 2011-05-17 NOTE — Patient Instructions (Signed)
12.5 mg for nov/ dec 10 mg in jan/ feb Blood work next visit

## 2011-05-22 NOTE — Assessment & Plan Note (Signed)
12.5 mg for nov/ dec 10 mg in jan/ feb Blood work next visit - BMET, ACE level

## 2011-09-26 ENCOUNTER — Encounter: Payer: Self-pay | Admitting: Pulmonary Disease

## 2011-09-26 ENCOUNTER — Ambulatory Visit (INDEPENDENT_AMBULATORY_CARE_PROVIDER_SITE_OTHER): Payer: 59 | Admitting: Pulmonary Disease

## 2011-09-26 ENCOUNTER — Other Ambulatory Visit (INDEPENDENT_AMBULATORY_CARE_PROVIDER_SITE_OTHER): Payer: 59

## 2011-09-26 ENCOUNTER — Ambulatory Visit (INDEPENDENT_AMBULATORY_CARE_PROVIDER_SITE_OTHER)
Admission: RE | Admit: 2011-09-26 | Discharge: 2011-09-26 | Disposition: A | Discharge status: Home / Self Care | Payer: 59 | Source: Ambulatory Visit | Attending: Pulmonary Disease | Admitting: Pulmonary Disease

## 2011-09-26 VITALS — BP 128/76 | HR 80 | Temp 98.6°F | Ht 69.5 in | Wt 160.2 lb

## 2011-09-26 DIAGNOSIS — D869 Sarcoidosis, unspecified: Secondary | ICD-10-CM

## 2011-09-26 LAB — BASIC METABOLIC PANEL
BUN: 14 mg/dL (ref 6–23)
Creatinine, Ser: 1 mg/dL (ref 0.4–1.5)
GFR: 110.63 mL/min (ref >60.00)
Glucose, Bld: 82 mg/dL (ref 70–99)
Potassium: 4.2 mEq/L (ref 3.5–5.1)

## 2011-09-26 MED ORDER — PREDNISONE 5 MG PO TABS: ORAL_TABLET | ORAL | Status: DC

## 2011-09-26 NOTE — Assessment & Plan Note (Addendum)
Drop prednisone gradually to lowest effective dose over next few months -Prednisone 10 mg daily in April & May For June & July, take 10 mg on m/w/f & 5 mg other days Starting August , take 5 mg daily Xr stable, await ace level rechk pfts next visit

## 2011-09-26 NOTE — Patient Instructions (Signed)
Xray & blood work today Prednisone 10 mg daily in April & May For June & July, take 10 mg on m/w/f & 5 mg other days Starting August , take 5 mg daily

## 2011-09-26 NOTE — Progress Notes (Signed)
  Subjective:    Patient ID: Ryan Horton, male    DOB: March 01, 1976, 36 y.o.   MRN: [id number]  HPI 36 year old male with sarcoidosis.  Presented 3/12 with weakness. for 1.5 months,ankle swelling in nov '11, redness of eyes, loss of appetite, 'sick'- flu like weakness towards evening >> blood work s/o infection, ? wc high  CT abdomen >> neg, CXR showed bihilar fullness  CT chest showed >>Bilateral hilar and mediastinal adenopathy. Slightly nodular appearing infiltrate identified in the left upper lobe.Given cipro for UTI  Labs >> ANA neg, ESR 20, RA neg , ACE 73  He was seen in jan'2008 for asthma like illness attributed to viral bronchitis - Spirometry nml then  Eye exam, baseline EKG -ok, Ca level ok  Started on 20 mg prednisone 3/12 PFTs >> mild restriction FVC & TLC 74%, DLCO 82 %  04/01/2011  When down to 10 mg he began to have breathing issues as prior so he went back up to 20mg  daily  Breathing back to nml , no skin lesions >> dropped to 15 mg  05/17/2011 - dropped to 12.5 mg  09/26/2011  Feels well on 12.5 mg,erratic-misses dose on occasion,  no skin lesions, no dyspnea, cough , able to exercise , no fatigue  cxr - stable , unchanged from 2012 Bicarb 34 on bmet ? Related to prednisone Planning a cruise trip   Review of Systems Patient denies significant dyspnea,cough, hemoptysis,  chest pain, palpitations, pedal edema, orthopnea, paroxysmal nocturnal dyspnea, lightheadedness, nausea, vomiting, abdominal or  leg pains      Objective:   Physical Exam Gen. Pleasant, well-nourished, in no distress ENT - no lesions, no post nasal drip Neck: No JVD, no thyromegaly, no carotid bruits Lungs: no use of accessory muscles, no dullness to percussion, clear without rales or rhonchi  Cardiovascular: Rhythm regular, heart sounds  normal, no murmurs or gallops, no peripheral edema Musculoskeletal: No deformities, no cyanosis or clubbing         Assessment & Plan:

## 2011-09-27 LAB — ANGIOTENSIN CONVERTING ENZYME: Angiotensin-Converting Enzyme: 50 U/L (ref 8–52)

## 2011-11-25 ENCOUNTER — Telehealth: Payer: Self-pay | Admitting: Pulmonary Disease

## 2011-11-25 MED ORDER — PREDNISONE 5 MG PO TABS: ORAL_TABLET | ORAL | Status: DC

## 2011-11-25 NOTE — Telephone Encounter (Signed)
I spoke with pt and is aware rx has been sent to the pharmacy 

## 2012-01-26 ENCOUNTER — Ambulatory Visit (INDEPENDENT_AMBULATORY_CARE_PROVIDER_SITE_OTHER): Payer: 59 | Admitting: Pulmonary Disease

## 2012-01-26 ENCOUNTER — Encounter: Payer: Self-pay | Admitting: Pulmonary Disease

## 2012-01-26 VITALS — BP 126/68 | HR 91 | Temp 98.4°F | Ht 69.5 in | Wt 169.4 lb

## 2012-01-26 DIAGNOSIS — D869 Sarcoidosis, unspecified: Secondary | ICD-10-CM

## 2012-01-26 NOTE — Patient Instructions (Addendum)
Lung function is stable -90% DROp prednisone to 5mg  m/w/f If no issues, drop to 5mg  daily on aug 20th CXR next visit

## 2012-01-26 NOTE — Progress Notes (Signed)
  Subjective:    Patient ID: Ryan Horton, male    DOB: 1975-11-04, 36 y.o.   MRN: [id number]  HPI 36 year old male with sarcoidosis.  Presented 3/12 with weakness. for 1.5 months,ankle swelling in nov '11, redness of eyes, loss of appetite, 'sick'- flu like weakness towards evening  CT abdomen >> neg, CXR showed bihilar fullness  CT chest showed >>Bilateral hilar and mediastinal adenopathy. Slightly nodular appearing infiltrate identified in the left upper lobe.Given cipro for UTI  Labs >> ANA neg, ESR 20, RA neg , ACE 73  He was seen in jan'2008 for asthma like illness attributed to viral bronchitis - Spirometry nml then  Eye exam, baseline EKG -ok, Ca level ok  Started on 20 mg prednisone 3/12  PFTs >> mild restriction FVC & TLC 74%, DLCO 82 %  04/01/2011  When down to 10 mg he began to have breathing issues as prior so he went back up to 20mg  daily  Breathing back to nml , no skin lesions >> dropped to 15 mg  05/17/2011 - dropped to 12.5 mg    01/26/2012 Gained 18 lbs since 5/12 , now on 10 mg pred no skin lesions, no dyspnea, cough , able to exercise , no fatigue  3/13 cxr - stable , unchanged from 2012  Bicarb 34 on bmet ? Related to prednisone  Planning a cruise trip  Spirometry - FVC preserved    Review of Systems Patient denies significant dyspnea,cough, hemoptysis,  chest pain, palpitations, pedal edema, orthopnea, paroxysmal nocturnal dyspnea, lightheadedness, nausea, vomiting, abdominal or  leg pains      Objective:   Physical Exam  Gen. Pleasant, well-nourished, in no distress ENT - no lesions, no post nasal drip Neck: No JVD, no thyromegaly, no carotid bruits Lungs: no use of accessory muscles, no dullness to percussion, clear without rales or rhonchi  Cardiovascular: Rhythm regular, heart sounds  normal, no murmurs or gallops, no peripheral edema Musculoskeletal: No deformities, no cyanosis or clubbing         Assessment & Plan:

## 2012-01-26 NOTE — Assessment & Plan Note (Signed)
NOn caseating granulomas in the setting of brief arthritis, mediastinal LNs, micronodular LUL infiltrate & granular appearance of airways is diagnostic of sarcoidosis Mild restriction on pFTs , DLCO preserved  Lung function is stable -90% DROp prednisone to 5mg  m/w/f If no issues, drop to 5mg  daily on aug 20th Annual eye exam CXR next visit

## 2012-04-27 ENCOUNTER — Ambulatory Visit: Payer: 59 | Admitting: Adult Health

## 2012-04-30 ENCOUNTER — Ambulatory Visit: Payer: 59 | Admitting: Adult Health

## 2012-05-01 ENCOUNTER — Telehealth: Payer: Self-pay | Admitting: Pulmonary Disease

## 2012-05-01 MED ORDER — PREDNISONE 5 MG PO TABS: ORAL_TABLET | ORAL | Status: DC

## 2012-05-01 NOTE — Telephone Encounter (Signed)
I spoke with pt and is aware RX has been sent to the pharmacy for prednisone. Nothing further was needed

## 2012-05-02 ENCOUNTER — Ambulatory Visit: Payer: 59 | Admitting: Adult Health

## 2012-05-09 ENCOUNTER — Ambulatory Visit (INDEPENDENT_AMBULATORY_CARE_PROVIDER_SITE_OTHER): Payer: 59 | Admitting: Adult Health

## 2012-05-09 ENCOUNTER — Encounter: Payer: Self-pay | Admitting: Adult Health

## 2012-05-09 ENCOUNTER — Ambulatory Visit (INDEPENDENT_AMBULATORY_CARE_PROVIDER_SITE_OTHER)
Admission: RE | Admit: 2012-05-09 | Discharge: 2012-05-09 | Disposition: A | Discharge status: Home / Self Care | Payer: 59 | Source: Ambulatory Visit | Attending: Adult Health | Admitting: Adult Health

## 2012-05-09 VITALS — BP 116/74 | HR 82 | Temp 97.1°F | Ht 69.0 in | Wt 168.8 lb

## 2012-05-09 DIAGNOSIS — D869 Sarcoidosis, unspecified: Secondary | ICD-10-CM

## 2012-05-09 DIAGNOSIS — Z23 Encounter for immunization: Secondary | ICD-10-CM

## 2012-05-09 MED ORDER — PREDNISONE 5 MG PO TABS: ORAL_TABLET | ORAL | Status: DC

## 2012-05-09 NOTE — Progress Notes (Signed)
Quick Note:  Called spoke with patient, advised of cxr results / recs as stated by TP. Pt verbalized his understanding and denied any questions. ______ 

## 2012-05-09 NOTE — Progress Notes (Signed)
  Subjective:    Patient ID: Ryan Horton, male    DOB: 07-26-1975, 36 y.o.   MRN: [id number]  HPI  36 year old male with sarcoidosis.  Presented 3/12 with weakness. for 1.5 months,ankle swelling in nov '11, redness of eyes, loss of appetite, 'sick'- flu like weakness towards evening  CT abdomen >> neg, CXR showed bihilar fullness  CT chest showed >>Bilateral hilar and mediastinal adenopathy. Slightly nodular appearing infiltrate identified in the left upper lobe.Given cipro for UTI  Labs >> ANA neg, ESR 20, RA neg , ACE 73  He was seen in jan'2008 for asthma like illness attributed to viral bronchitis - Spirometry nml then  Eye exam, baseline EKG -ok, Ca level ok  Started on 20 mg prednisone 3/12  PFTs >> mild restriction FVC & TLC 74%, DLCO 82 %  04/01/2011  When down to 10 mg he began to have breathing issues as prior so he went back up to 20mg  daily  Breathing back to nml , no skin lesions >> dropped to 15 mg  05/17/2011 - dropped to 12.5 mg    01/26/12  Gained 18 lbs since 5/12 , now on 10 mg pred no skin lesions, no dyspnea, cough , able to exercise , no fatigue  3/13 cxr - stable , unchanged from 2012  Bicarb 34 on bmet ? Related to prednisone  Planning a cruise trip  Spirometry - FVC preserved >>pred drop to 10/5   05/09/2012 Follow up Sarcoid  Patient returns for a three-month followup for sarcoidosis He reports that he is doing very well with no flare in cough, shortness of breath. Activity tolerance He has been getting his annual eye exams He denies any rash/skin lesions He is currently on prednisone 10 mg alternating with 5 mg     Review of Systems  Patient denies significant dyspnea,cough, hemoptysis,  chest pain, palpitations, pedal edema, orthopnea, paroxysmal nocturnal dyspnea, lightheadedness, nausea, vomiting, abdominal or  leg pains      Objective:   Physical Exam   Gen. Pleasant, well-nourished, in no distress ENT - no lesions, no post nasal  drip Neck: No JVD, no thyromegaly, no carotid bruits Lungs: no use of accessory muscles, no dullness to percussion, clear without rales or rhonchi  Cardiovascular: Rhythm regular, heart sounds  normal, no murmurs or gallops, no peripheral edema Musculoskeletal: No deformities, no cyanosis or clubbing         Assessment & Plan:

## 2012-05-09 NOTE — Addendum Note (Signed)
Addended by: Boone Master E on: 05/09/2012 10:36 AM   Modules accepted: Orders

## 2012-05-09 NOTE — Patient Instructions (Addendum)
Flu shot today  Decrease Prednisone 5mg  daily until January 1-decrease to 5mg  every other day until seen back in office follow up Dr. Vassie Loll  In 3 months  Chest xray today

## 2012-05-09 NOTE — Assessment & Plan Note (Signed)
Compensated w/out flare - taper steroids slowly   Plan Flu shot today  Decrease Prednisone 5mg  daily until January 1-decrease to 5mg  every other day until seen back in office follow up Dr. Vassie Loll  In 3 months  Chest xray today

## 2012-06-04 ENCOUNTER — Telehealth: Payer: Self-pay | Admitting: Pulmonary Disease

## 2012-06-04 NOTE — Telephone Encounter (Signed)
I have faxed this over to Dr. Dionicia Abler Office.

## 2012-06-04 NOTE — Telephone Encounter (Signed)
He is cleared for arthroscopic knee surgery from pulmonary sarcoidosis standpt. Stay on prednisone as instructed

## 2012-07-05 ENCOUNTER — Telehealth: Payer: Self-pay | Admitting: Pulmonary Disease

## 2012-07-05 MED ORDER — PREDNISONE 5 MG PO TABS: ORAL_TABLET | ORAL | Status: DC

## 2012-07-05 NOTE — Telephone Encounter (Signed)
Spoke with pt to verify the msg Rx was sent to pharm to prednisone  Nothing further needed

## 2012-08-08 ENCOUNTER — Encounter: Payer: Self-pay | Admitting: Pulmonary Disease

## 2012-08-08 ENCOUNTER — Ambulatory Visit (INDEPENDENT_AMBULATORY_CARE_PROVIDER_SITE_OTHER): Payer: 59 | Admitting: Pulmonary Disease

## 2012-08-08 VITALS — BP 124/68 | HR 88 | Temp 99.2°F | Ht 69.5 in | Wt 161.0 lb

## 2012-08-08 DIAGNOSIS — D869 Sarcoidosis, unspecified: Secondary | ICD-10-CM

## 2012-08-08 NOTE — Patient Instructions (Addendum)
Drop prednisone 5mg  M/F for Feb,  Once/wk for March- then STOP Call for symptoms as discussed

## 2012-08-08 NOTE — Assessment & Plan Note (Signed)
NOn caseating granulomas in the setting of brief arthritis, mediastinal LNs, micronodular LUL infiltrate & granular appearance of airways is diagnostic of sarcoidosis Mild restriction on pFTs , DLCO preserved  Drop prednisone 5mg  M/F for Feb,  Once/wk for March- then STOP Call for symptoms as discussed Rpt pfts in 70m

## 2012-08-08 NOTE — Progress Notes (Signed)
  Subjective:    Patient ID: Ryan Horton, male    DOB: 1975/08/24, 37 y.o.   MRN: [id number]  HPI  37 year old male with sarcoidosis.  Presented 3/12 with weakness. for 1.5 months,ankle swelling in nov '11, redness of eyes, loss of appetite, 'sick'- flu like weakness towards evening  CT abdomen >> neg, CXR showed bihilar fullness  CT chest showed >>Bilateral hilar and mediastinal adenopathy. Slightly nodular appearing infiltrate identified in the left upper lobe.Given cipro for UTI  Labs >> ANA neg, ESR 20, RA neg , ACE 73  He was seen in jan'2008 for asthma like illness attributed to viral bronchitis - Spirometry nml then  Eye exam, baseline EKG -ok, Ca level ok  Started on 20 mg prednisone 3/12  PFTs >> mild restriction FVC & TLC 74%, DLCO 82 %  04/01/2011  When down to 10 mg he began to have breathing issues as prior so he went back up to 20mg  daily  Breathing back to nml , no skin lesions >> dropped to 15 mg   7/13  Spirometry - FVC preserved >>pred drop to 10/5     08/08/2012 Underwent  arthroscopic knee surgery  no skin lesions, no dyspnea, cough , able to exercise , no fatigue  Pt states his breathing has been excellent. Denies any wheezing, chest tx, cough, Has no complaints today. Pred has been gradually dropped to 5mg  qod since 7/13 Wt loss - intentional  Review of Systems neg for any significant sore throat, dysphagia, itching, sneezing, nasal congestion or excess/ purulent secretions, fever, chills, sweats, unintended wt loss, pleuritic or exertional cp, hempoptysis, orthopnea pnd or change in chronic leg swelling. Also denies presyncope, palpitations, heartburn, abdominal pain, nausea, vomiting, diarrhea or change in bowel or urinary habits, dysuria,hematuria, rash, arthralgias, visual complaints, headache, numbness weakness or ataxia.     Objective:   Physical Exam  Gen. Pleasant, well-nourished, in no distress ENT - no lesions, no post nasal drip Neck: No JVD,  no thyromegaly, no carotid bruits Lungs: no use of accessory muscles, no dullness to percussion, clear without rales or rhonchi  Cardiovascular: Rhythm regular, heart sounds  normal, no murmurs or gallops, no peripheral edema Musculoskeletal: No deformities, no cyanosis or clubbing         Assessment & Plan:

## 2013-01-31 ENCOUNTER — Ambulatory Visit (INDEPENDENT_AMBULATORY_CARE_PROVIDER_SITE_OTHER)
Admission: RE | Admit: 2013-01-31 | Discharge: 2013-01-31 | Disposition: A | Discharge status: Home / Self Care | Payer: 59 | Source: Ambulatory Visit | Attending: Internal Medicine | Admitting: Internal Medicine

## 2013-01-31 ENCOUNTER — Encounter: Payer: Self-pay | Admitting: Internal Medicine

## 2013-01-31 ENCOUNTER — Ambulatory Visit (INDEPENDENT_AMBULATORY_CARE_PROVIDER_SITE_OTHER): Payer: 59 | Admitting: Internal Medicine

## 2013-01-31 ENCOUNTER — Other Ambulatory Visit (INDEPENDENT_AMBULATORY_CARE_PROVIDER_SITE_OTHER): Payer: 59

## 2013-01-31 VITALS — BP 140/78 | HR 86 | Temp 98.4°F | Resp 16 | Wt 156.0 lb

## 2013-01-31 DIAGNOSIS — B360 Pityriasis versicolor: Secondary | ICD-10-CM

## 2013-01-31 DIAGNOSIS — D869 Sarcoidosis, unspecified: Secondary | ICD-10-CM

## 2013-01-31 DIAGNOSIS — R21 Rash and other nonspecific skin eruption: Secondary | ICD-10-CM | POA: Insufficient documentation

## 2013-01-31 DIAGNOSIS — M545 Low back pain, unspecified: Secondary | ICD-10-CM

## 2013-01-31 DIAGNOSIS — Z23 Encounter for immunization: Secondary | ICD-10-CM

## 2013-01-31 DIAGNOSIS — M79605 Pain in left leg: Secondary | ICD-10-CM | POA: Insufficient documentation

## 2013-01-31 LAB — COMPREHENSIVE METABOLIC PANEL
Albumin: 4.1 g/dL (ref 3.5–5.2)
BUN: 16 mg/dL (ref 6–23)
Calcium: 10 mg/dL (ref 8.4–10.5)
Chloride: 102 mEq/L (ref 96–112)
Creatinine, Ser: 1.1 mg/dL (ref 0.4–1.5)
Glucose, Bld: 86 mg/dL (ref 70–99)
Potassium: 4 mEq/L (ref 3.5–5.1)

## 2013-01-31 LAB — CBC WITH DIFFERENTIAL/PLATELET
Basophils Relative: 0.4 % (ref 0.0–3.0)
Eosinophils Relative: 3.8 % (ref 0.0–5.0)
Hemoglobin: 14.7 g/dL (ref 13.0–17.0)
Lymphocytes Relative: 14.9 % (ref 12.0–46.0)
Monocytes Relative: 14.8 % — ABNORMAL HIGH (ref 3.0–12.0)
Neutro Abs: 4.2 10*3/uL (ref 1.4–7.7)
Neutrophils Relative %: 66.1 % (ref 43.0–77.0)
RBC: 5.26 Mil/uL (ref 4.22–5.81)
WBC: 6.4 10*3/uL (ref 4.5–10.5)

## 2013-01-31 LAB — URINALYSIS, ROUTINE W REFLEX MICROSCOPIC
Hgb urine dipstick: NEGATIVE
Nitrite: NEGATIVE
Specific Gravity, Urine: >=1.03 (ref 1.000–1.030)
Total Protein, Urine: NEGATIVE
pH: 6 (ref 5.0–8.0)

## 2013-01-31 MED ORDER — SELENIUM SULFIDE 2.5 % EX LOTN: TOPICAL_LOTION | Freq: Every day | CUTANEOUS | Status: DC | PRN

## 2013-01-31 MED ORDER — CLOBETASOL PROPIONATE 0.05 % EX OINT: TOPICAL_OINTMENT | Freq: Two times a day (BID) | CUTANEOUS | Status: DC

## 2013-01-31 NOTE — Progress Notes (Signed)
Subjective:    Patient ID: Ryan Horton, male    DOB: 1976/03/03, 37 y.o.   MRN: [id number]  Rash This is a new problem. The current episode started 1 to 4 weeks ago. The problem is unchanged. The affected locations include the torso. The rash is characterized by itchiness, dryness and scaling. He was exposed to nothing. Pertinent negatives include no anorexia, congestion, cough, diarrhea, eye pain, facial edema, fatigue, fever, joint pain, nail changes, rhinorrhea, shortness of breath, sore throat or vomiting. Past treatments include nothing.  Back Pain This is a new problem. The current episode started in the past 7 days. The problem occurs intermittently. The problem has been gradually improving since onset. The pain is present in the lumbar spine. The quality of the pain is described as aching. The pain radiates to the left thigh. The pain is at a severity of 2/10. The pain is mild. The symptoms are aggravated by standing and sitting. Associated symptoms include numbness (he had a brief episode of nembness when the pain started but that has resolved). Pertinent negatives include no abdominal pain, bladder incontinence, bowel incontinence, chest pain, dysuria, fever, headaches, leg pain, paresis, paresthesias, pelvic pain, perianal numbness, tingling, weakness or weight loss. He has tried NSAIDs for the symptoms. The treatment provided significant relief.      Review of Systems  Constitutional: Negative.  Negative for fever, chills, weight loss, diaphoresis and fatigue.  HENT: Negative.  Negative for congestion, sore throat and rhinorrhea.   Eyes: Negative.  Negative for pain.  Respiratory: Negative.  Negative for cough, choking, shortness of breath, wheezing and stridor.   Cardiovascular: Negative.  Negative for chest pain, palpitations and leg swelling.  Gastrointestinal: Negative.  Negative for nausea, vomiting, abdominal pain, diarrhea, constipation, anorexia and bowel incontinence.   Endocrine: Negative.   Genitourinary: Negative.  Negative for bladder incontinence, dysuria and pelvic pain.  Musculoskeletal: Positive for back pain. Negative for myalgias, joint pain, joint swelling, arthralgias and gait problem.  Skin: Positive for rash. Negative for nail changes, color change, pallor and wound.  Allergic/Immunologic: Negative.   Neurological: Positive for numbness (he had a brief episode of nembness when the pain started but that has resolved). Negative for dizziness, tingling, weakness, headaches and paresthesias.  Hematological: Negative.  Negative for adenopathy. Does not bruise/bleed easily.  Psychiatric/Behavioral: Negative.        Objective:   Physical Exam  Vitals reviewed. Constitutional: He appears well-developed and well-nourished. No distress.  HENT:  Head: Normocephalic and atraumatic.  Mouth/Throat: Oropharynx is clear and moist. No oropharyngeal exudate.  Eyes: Conjunctivae are normal. Right eye exhibits no discharge. Left eye exhibits no discharge. No scleral icterus.  Neck: Normal range of motion. Neck supple. No JVD present. No tracheal deviation present. No thyromegaly present.  Cardiovascular: Normal rate, regular rhythm, normal heart sounds and intact distal pulses.  Exam reveals no gallop and no friction rub.   No murmur heard. Pulmonary/Chest: Effort normal and breath sounds normal. No stridor. No respiratory distress. He has no wheezes. He has no rales. He exhibits no tenderness.  Abdominal: Soft. Bowel sounds are normal. He exhibits no distension and no mass. There is no tenderness. There is no rebound and no guarding.  Musculoskeletal: Normal range of motion. He exhibits no edema and no tenderness.       Lumbar back: Normal.  Lymphadenopathy:    He has no cervical adenopathy.  Neurological: He is alert. He has normal strength. He displays no atrophy and normal reflexes.  No cranial nerve deficit or sensory deficit. He exhibits normal muscle  tone. He displays a negative Romberg sign. Coordination and gait normal.  Reflex Scores:      Tricep reflexes are 1+ on the right side and 1+ on the left side.      Bicep reflexes are 1+ on the right side and 1+ on the left side.      Brachioradialis reflexes are 1+ on the right side and 1+ on the left side.      Patellar reflexes are 1+ on the right side and 1+ on the left side.      Achilles reflexes are 1+ on the right side and 1+ on the left side. Neg SLR in BLE  Skin: Skin is warm, dry and intact. Rash noted. No bruising, no ecchymosis, no lesion, no petechiae and no purpura noted. Rash is macular and papular. Rash is not maculopapular, not nodular, not pustular, not vesicular and not urticarial. He is not diaphoretic. No erythema. No pallor.  Over the torso front and back there are several lesions, most are a depigmented macule with scale and shimmering. A few a raised macules and papules with scale.  Over the right upper back one of the papules was biopsied - the area was prepped and draped in sterile fashion, local anesthesia with 2% ldio with epi (1.5 cc's were used). A 4 mm punch incision was done. The wound was closed with 1 interrupted suture (4.0 nylon, PC-3). He tolerated it well with no blood loss or complications.  Psychiatric: He has a normal mood and affect. His behavior is normal. Judgment and thought content normal.     Lab Results  Component Value Date   WBC 9.0 10/27/2010   HGB 13.1 10/27/2010   HCT 39.4 10/27/2010   PLT 277.0 10/27/2010   GLUCOSE 82 09/26/2011   CHOL 202* 10/27/2010   TRIG 40.0 10/27/2010   HDL 77.20 10/27/2010   LDLDIRECT 114.5 10/27/2010   NA 142 09/26/2011   K 4.2 09/26/2011   CL 103 09/26/2011   CREATININE 1.0 09/26/2011   BUN 14 09/26/2011   CO2 34* 09/26/2011   TSH 0.39 10/27/2010       Assessment & Plan:

## 2013-01-31 NOTE — Patient Instructions (Signed)
Back Pain, Adult Low back pain is very common. About 1 in 5 people have back pain.The cause of low back pain is rarely dangerous. The pain often gets better over time.About half of people with a sudden onset of back pain feel better in just 2 weeks. About 8 in 10 people feel better by 6 weeks.  CAUSES Some common causes of back pain include:  Strain of the muscles or ligaments supporting the spine.  Wear and tear (degeneration) of the spinal discs.  Arthritis.  Direct injury to the back. DIAGNOSIS Most of the time, the direct cause of low back pain is not known.However, back pain can be treated effectively even when the exact cause of the pain is unknown.Answering your caregiver's questions about your overall health and symptoms is one of the most accurate ways to make sure the cause of your pain is not dangerous. If your caregiver needs more information, he or she may order lab work or imaging tests (X-rays or MRIs).However, even if imaging tests show changes in your back, this usually does not require surgery. HOME CARE INSTRUCTIONS For many people, back pain returns.Since low back pain is rarely dangerous, it is often a condition that people can learn to manageon their own.   Remain active. It is stressful on the back to sit or stand in one place. Do not sit, drive, or stand in one place for more than 30 minutes at a time. Take short walks on level surfaces as soon as pain allows.Try to increase the length of time you walk each day.  Do not stay in bed.Resting more than 1 or 2 days can delay your recovery.  Do not avoid exercise or work.Your body is made to move.It is not dangerous to be active, even though your back may hurt.Your back will likely heal faster if you return to being active before your pain is gone.  Pay attention to your body when you bend and lift. Many people have less discomfortwhen lifting if they bend their knees, keep the load close to their bodies,and  avoid twisting. Often, the most comfortable positions are those that put less stress on your recovering back.  Find a comfortable position to sleep. Use a firm mattress and lie on your side with your knees slightly bent. If you lie on your back, put a pillow under your knees.  Only take over-the-counter or prescription medicines as directed by your caregiver. Over-the-counter medicines to reduce pain and inflammation are often the most helpful.Your caregiver may prescribe muscle relaxant drugs.These medicines help dull your pain so you can more quickly return to your normal activities and healthy exercise.  Put ice on the injured area.  Put ice in a plastic bag.  Place a towel between your skin and the bag.  Leave the ice on for 15-20 minutes, 3-4 times a day for the first 2 to 3 days. After that, ice and heat may be alternated to reduce pain and spasms.  Ask your caregiver about trying back exercises and gentle massage. This may be of some benefit.  Avoid feeling anxious or stressed.Stress increases muscle tension and can worsen back pain.It is important to recognize when you are anxious or stressed and learn ways to manage it.Exercise is a great option. SEEK MEDICAL CARE IF:  You have pain that is not relieved with rest or medicine.  You have pain that does not improve in 1 week.  You have new symptoms.  You are generally not feeling well. SEEK   IMMEDIATE MEDICAL CARE IF:   You have pain that radiates from your back into your legs.  You develop new bowel or bladder control problems.  You have unusual weakness or numbness in your arms or legs.  You develop nausea or vomiting.  You develop abdominal pain.  You feel faint. Document Released: 06/20/2005 Document Revised: 12/20/2011 Document Reviewed: 11/08/2010 [facility] Patient Information 2014 [place], [place]. Rash A rash is a change in the color or texture of your skin. There are many different types of rashes. You may  have other problems that accompany your rash. CAUSES   Infections.  Allergic reactions. This can include allergies to pets or foods.  Certain medicines.  Exposure to certain chemicals, soaps, or cosmetics.  Heat.  Exposure to poisonous plants.  Tumors, both cancerous and noncancerous. SYMPTOMS   Redness.  Scaly skin.  Itchy skin.  Dry or cracked skin.  Bumps.  Blisters.  Pain. DIAGNOSIS  Your caregiver may do a physical exam to determine what type of rash you have. A skin sample (biopsy) may be taken and examined under a microscope. TREATMENT  Treatment depends on the type of rash you have. Your caregiver may prescribe certain medicines. For serious conditions, you may need to see a skin doctor (dermatologist). HOME CARE INSTRUCTIONS   Avoid the substance that caused your rash.  Do not scratch your rash. This can cause infection.  You may take cool baths to help stop itching.  Only take over-the-counter or prescription medicines as directed by your caregiver.  Keep all follow-up appointments as directed by your caregiver. SEEK IMMEDIATE MEDICAL CARE IF:  You have increasing pain, swelling, or redness.  You have a fever.  You have new or severe symptoms.  You have body aches, diarrhea, or vomiting.  Your rash is not better after 3 days. MAKE SURE YOU:  Understand these instructions.  Will watch your condition.  Will get help right away if you are not doing well or get worse. Document Released: 06/10/2002 Document Revised: 09/12/2011 Document Reviewed: 04/04/2011 [facility] Patient Information 2014 [place], [place].

## 2013-02-01 ENCOUNTER — Encounter: Payer: Self-pay | Admitting: Internal Medicine

## 2013-02-01 NOTE — Assessment & Plan Note (Signed)
I am concerned that he may have sarcoid manifestations of the skin so a biopsy was done. Will treat for now with topical steroids, he may need to restart systemic steroids. Will check his labs to look for other causes of rash like syphilis.

## 2013-02-01 NOTE — Assessment & Plan Note (Signed)
I will treat this with selsun

## 2013-02-01 NOTE — Assessment & Plan Note (Signed)
Plain film is normal Exam is normal He is improving with nsaids

## 2013-02-06 ENCOUNTER — Telehealth: Payer: Self-pay

## 2013-02-06 NOTE — Telephone Encounter (Signed)
Phone call from patient stating he received his lab results and does not understand them. He is requesting a call back.

## 2013-02-06 NOTE — Telephone Encounter (Signed)
Several nonspecific abnormals, we can discuss when he comes in for suture removal

## 2013-02-06 NOTE — Telephone Encounter (Signed)
Phone call to patient letting him know his labs will be discussed tomorrow when he comes in for suture removal. He expressed understanding.

## 2013-02-07 ENCOUNTER — Ambulatory Visit: Payer: 59 | Admitting: Internal Medicine

## 2013-02-08 ENCOUNTER — Encounter: Payer: Self-pay | Admitting: Internal Medicine

## 2013-02-08 ENCOUNTER — Ambulatory Visit (INDEPENDENT_AMBULATORY_CARE_PROVIDER_SITE_OTHER): Payer: 59 | Admitting: Internal Medicine

## 2013-02-08 VITALS — BP 130/82 | HR 82 | Temp 98.2°F | Wt 164.4 lb

## 2013-02-08 DIAGNOSIS — M419 Scoliosis, unspecified: Secondary | ICD-10-CM

## 2013-02-08 DIAGNOSIS — R748 Abnormal levels of other serum enzymes: Secondary | ICD-10-CM

## 2013-02-08 DIAGNOSIS — M4120 Other idiopathic scoliosis, site unspecified: Secondary | ICD-10-CM

## 2013-02-08 DIAGNOSIS — Z4802 Encounter for removal of sutures: Secondary | ICD-10-CM

## 2013-02-08 DIAGNOSIS — B360 Pityriasis versicolor: Secondary | ICD-10-CM

## 2013-02-08 DIAGNOSIS — D869 Sarcoidosis, unspecified: Secondary | ICD-10-CM

## 2013-02-08 DIAGNOSIS — L309 Dermatitis, unspecified: Secondary | ICD-10-CM

## 2013-02-08 DIAGNOSIS — L259 Unspecified contact dermatitis, unspecified cause: Secondary | ICD-10-CM

## 2013-02-08 NOTE — Patient Instructions (Signed)
Suture Removal  Your caregiver has removed your sutures today. If skin adhesive strips were applied at the time of suturing, or applied following removal of the sutures today, they will begin to peel off in a couple more days. If skin adhesive strips remain after 14 days, they may be removed.  HOME CARE INSTRUCTIONS    Change any bandages (dressings) at least once a day or as directed by your caregiver. If the bandage sticks, soak it off with warm, soapy water.   Wash the area with soap and water to remove all the cream or ointment (if you were instructed to use any) 2 times a day. Rinse off the soap and pat the area dry with a clean towel.   Reapply cream or ointment as directed by your caregiver. This will help prevent infection and keep the bandage from sticking.   Keep the wound area dry and clean. If the bandage becomes wet, dirty, or develops a bad smell, change it as soon as possible.   Only take over-the-counter or prescription medicines for pain, discomfort, or fever as directed by your caregiver.   Use sunscreen when out in the sun. New scars become sunburned easily.   Return to your caregivers office in in 7 days or as directed to have your sutures removed.  You may need a tetanus shot if:   You cannot remember when you had your last tetanus shot.   You have never had a tetanus shot.   The injury broke your skin.  If you got a tetanus shot, your arm may swell, get red, and feel warm to the touch. This is common and not a problem. If you need a tetanus shot and you choose not to have one, there is a rare chance of getting tetanus. Sickness from tetanus can be serious.  SEEK IMMEDIATE MEDICAL CARE IF:    There is redness, swelling, or increasing pain in the wound.   Pus is coming from the wound.   An unexplained oral temperature above 102 F (38.9 C) develops.   You notice a bad smell coming from the wound or dressing.   The wound breaks open (edges not staying together) after sutures have  been removed.  Document Released: 03/15/2001 Document Revised: 09/12/2011 Document Reviewed: 05/14/2007  [employer] Patient Information 2014 [employer].

## 2013-02-08 NOTE — Progress Notes (Signed)
Subjective:    Patient ID: Ryan Horton, male    DOB: [birth year], 37 y.o.   MRN: [id number]  HPI  Pt presents to the clinic today for stitch removal. He had a biopsy of an area on his back, concerning for sarcoidosis lesion of the skin. Punch biopsy was performed and one stitch was places. He denies redness, irritation, swelling or drainage from the site. Additionally, he would like to go over his lab, xray and biopsy results.  Review of Systems      History reviewed. No pertinent past medical history.  Current Outpatient Prescriptions  Medication Sig Dispense Refill  . clobetasol ointment (TEMOVATE) 0.05 % Apply topically 2 (two) times daily.  45 g  1  . selenium sulfide (SELSUN) 2.5 % shampoo Apply topically daily as needed for itching. Apply once daily. Lather with a small amount of water. Leave on for 10 minutes. Rinse off.  Repeat this daily for 7 days.  118 mL  1   No current facility-administered medications for this visit.    Allergies  Allergen Reactions  . Other     EKG electrodes-rash    Family History  Problem Relation Age of Onset  . Arthritis Father     History   Social History  . Marital Status: Married    Spouse Name: N/A    Number of Children: N/A  . Years of Education: N/A   Occupational History  . Copywriter, advertising at [employer]    Social History Main Topics  . Smoking status: Never Smoker   . Smokeless tobacco: Never Used  . Alcohol Use: 1.2 oz/week    2 Cans of beer per week  . Drug Use: No  . Sexually Active: Yes   Other Topics Concern  . Not on file   Social History Narrative   Used to work with [employer]     Constitutional: Denies fever, malaise, fatigue, headache or abrupt weight changes.  Musculoskeletal: Denies decrease in range of motion, difficulty with gait, muscle pain or joint pain and swelling.  Skin: Pt reports rash on back. Denies redness, lesions or ulcercations.  Neurological: Pt reports numbness of the left  leg. Denies dizziness, difficulty with memory, difficulty with speech or problems with balance and coordination.   No other specific complaints in a complete review of systems (except as listed in HPI above).  Objective:   Physical Exam   BP 130/82  Pulse 82  Temp(Src) 98.2 F (36.8 C) (Oral)  Wt 164 lb 6.4 oz (74.571 kg)  BMI 23.94 kg/m2  SpO2 97% Wt Readings from Last 3 Encounters:  02/08/13 164 lb 6.4 oz (74.571 kg)  01/31/13 156 lb (70.761 kg)  08/08/12 161 lb (73.029 kg)    General: Appears his stated age, well developed, well nourished in NAD. Skin: Warm, dry and intact. No lesions or ulcerations noted. Tinea versicolor noted on upper back. Small scab noted on right upper back, intact with 1 stitch noted, no redness, swelling or drainage noted. Cardiovascular: Normal rate and rhythm. S1,S2 noted.  No murmur, rubs or gallops noted. No JVD or BLE edema. No carotid bruits noted. Pulmonary/Chest: Normal effort and positive vesicular breath sounds. No respiratory distress. No wheezes, rales or ronchi noted.  Musculoskeletal: Normal range of motion. No signs of joint swelling. No difficulty with gait.  Neurological: Alert and oriented. Cranial nerves II-XII intact. Coordination normal. +DTRs bilaterally.   BMET    Component Value Date/Time   NA 140 01/31/2013 0855  K 4.0 01/31/2013 0855   CL 102 01/31/2013 0855   CO2 30 01/31/2013 0855   GLUCOSE 86 01/31/2013 0855   BUN 16 01/31/2013 0855   CREATININE 1.1 01/31/2013 0855   CALCIUM 10.0 01/31/2013 0855    Lipid Panel     Component Value Date/Time   CHOL 202* 10/27/2010 1703   TRIG 40.0 10/27/2010 1703   HDL 77.20 10/27/2010 1703   CHOLHDL 3 10/27/2010 1703   VLDL 8.0 10/27/2010 1703    CBC    Component Value Date/Time   WBC 6.4 01/31/2013 0855   RBC 5.26 01/31/2013 0855   HGB 14.7 01/31/2013 0855   HCT 44.6 01/31/2013 0855   PLT 275.0 01/31/2013 0855   MCV 84.8 01/31/2013 0855   MCH 26.7 09/27/2010 0028   MCHC 33.0 01/31/2013  0855   RDW 14.4 01/31/2013 0855   LYMPHSABS 1.0 01/31/2013 0855   MONOABS 0.9 01/31/2013 0855   EOSABS 0.2 01/31/2013 0855   BASOSABS 0.0 01/31/2013 0855    Hgb A1C No results found for this basename: HGBA1C        Assessment & Plan:   Suture removal from biopsy site:  1 sutured removed After care instructions given  Lab, xray and biopsy results:  Elevated ESR, but pt does have sarcoid with recurrent symptoms. Has appointment with Dr. Vassie Loll in September. Elevated Alk phos- only mildly elevated, does not consume excessive alcohol, no tylenol use, no gallbladder issues that he is aware of. Continue to monitor. Skin biopsy- inflamed dermatitis, no evidence of sarcoid skin lesion Xray lumbar spine- mild levoscoliosis, not spondylosis or stenosis  RTC as needed  RTC as

## 2013-03-18 ENCOUNTER — Encounter: Payer: Self-pay | Admitting: Pulmonary Disease

## 2013-03-18 ENCOUNTER — Ambulatory Visit (INDEPENDENT_AMBULATORY_CARE_PROVIDER_SITE_OTHER): Payer: 59 | Admitting: Pulmonary Disease

## 2013-03-18 VITALS — BP 112/58 | HR 94 | Ht 69.0 in | Wt 159.0 lb

## 2013-03-18 DIAGNOSIS — Z23 Encounter for immunization: Secondary | ICD-10-CM

## 2013-03-18 DIAGNOSIS — D869 Sarcoidosis, unspecified: Secondary | ICD-10-CM

## 2013-03-18 NOTE — Patient Instructions (Signed)
Sarcoidosis is stable - lung function is improved Stay off prednisone Flu shot annually

## 2013-03-18 NOTE — Progress Notes (Signed)
PFT done today. 

## 2013-03-18 NOTE — Progress Notes (Signed)
  Subjective:    Patient ID: Ryan Horton, male    DOB: 1976/04/11, 37 y.o.   MRN: [id number]  HPI  37 year old male with sarcoidosis.  Presented 3/12 with Bilateral hilar and mediastinal adenopathy. Slightly nodular appearing infiltrate identified in the left upper lobe.,weakness. for 1.5 months,ankle swelling in nov '11, redness of eyes, loss of appetite, 'sick'- flu like weakness towards evening   Labs >> ANA neg, ESR 20, RA neg , ACE 73  He was seen in jan'2008 for asthma like illness attributed to viral bronchitis - Spirometry nml then  Eye exam, baseline EKG -ok, Ca level ok  PFTs >> mild restriction FVC & TLC 74%, DLCO 82 %  TBBx -NOn caseating granulomas   03/18/2013 Started on 20 mg prednisone 3/12, gradually dropped to 5mg  qod since 7/13 , stopped 3/14 Wt loss - intentional  Pt reports breathing has been fine. Pt reports he did have a scare back in july he was having numbness in his left leg. This is eventually go away and has been fine since Ca 10 PFTs - FVC improved 4.30 -91%, TLC, 78% - 6.72, DLCO 31 -78%   Review of Systems neg for any significant sore throat, dysphagia, itching, sneezing, nasal congestion or excess/ purulent secretions, fever, chills, sweats, unintended wt loss, pleuritic or exertional cp, hempoptysis, orthopnea pnd or change in chronic leg swelling. Also denies presyncope, palpitations, heartburn, abdominal pain, nausea, vomiting, diarrhea or change in bowel or urinary habits, dysuria,hematuria, rash, arthralgias, visual complaints, headache, numbness weakness or ataxia.     Objective:   Physical Exam  Gen. Pleasant, well-nourished, in no distress ENT - no lesions, no post nasal drip Neck: No JVD, no thyromegaly, no carotid bruits Lungs: no use of accessory muscles, no dullness to percussion, clear without rales or rhonchi  Cardiovascular: Rhythm regular, heart sounds  normal, no murmurs or gallops, no peripheral edema Musculoskeletal: No  deformities, no cyanosis or clubbing        Assessment & Plan:

## 2013-03-18 NOTE — Assessment & Plan Note (Signed)
Mild restriction on pFTs , DLCO preserved - improved 03/2013 On prednisone from 3/12 to 3/14 OK to stay off pred Annual flu shot Call for new symptoms

## 2013-03-28 ENCOUNTER — Encounter: Payer: Self-pay | Admitting: Pulmonary Disease

## 2013-08-29 ENCOUNTER — Ambulatory Visit: Payer: 59 | Admitting: Internal Medicine

## 2013-08-29 DIAGNOSIS — Z0289 Encounter for other administrative examinations: Secondary | ICD-10-CM

## 2013-10-16 LAB — PULMONARY FUNCTION TEST
DL/VA % pred: 98 %
DL/VA: 4.55 ml/min/mmHg/L
DLCO unc % pred: 78 %
DLCO unc: 24.29 ml/min/mmHg
FEF 25-75 Post: 3.12 L/sec
FEF 25-75 Pre: 3.51 L/sec
FEF2575-%Change-Post: -11 %
FEF2575-%Pred-Post: 81 %
FEF2575-%Pred-Pre: 92 %
FEV1-%Change-Post: -2 %
FEV1-%Pred-Post: 89 %
FEV1-%Pred-Pre: 91 %
FEV1-Post: 3.17 L
FEV1-Pre: 3.25 L
FEV1FVC-%Change-Post: 2 %
FEV1FVC-%Pred-Pre: 99 %
FEV6-%Change-Post: -5 %
FEV6-%Pred-Post: 88 %
FEV6-%Pred-Pre: 92 %
FEV6-Post: 3.72 L
FEV6-Pre: 3.92 L
FEV6FVC-%Change-Post: 0 %
FEV6FVC-%Pred-Post: 101 %
FEV6FVC-%Pred-Pre: 101 %
FVC-%Change-Post: -4 %
FVC-%Pred-Post: 87 %
FVC-%Pred-Pre: 91 %
FVC-Post: 3.74 L
Post FEV1/FVC ratio: 85 %
Post FEV6/FVC ratio: 99 %
Pre FEV1/FVC ratio: 83 %
Pre FEV6/FVC Ratio: 100 %
RV % pred: 89 %
RV: 1.53 L
TLC % pred: 78 %
TLC: 5.25 L

## 2014-04-22 ENCOUNTER — Ambulatory Visit (INDEPENDENT_AMBULATORY_CARE_PROVIDER_SITE_OTHER): Payer: 59 | Admitting: Pulmonary Disease

## 2014-04-22 ENCOUNTER — Other Ambulatory Visit (INDEPENDENT_AMBULATORY_CARE_PROVIDER_SITE_OTHER): Payer: 59

## 2014-04-22 ENCOUNTER — Ambulatory Visit (INDEPENDENT_AMBULATORY_CARE_PROVIDER_SITE_OTHER)
Admission: RE | Admit: 2014-04-22 | Discharge: 2014-04-22 | Disposition: A | Discharge status: Home / Self Care | Payer: 59 | Source: Ambulatory Visit | Attending: Pulmonary Disease | Admitting: Pulmonary Disease

## 2014-04-22 ENCOUNTER — Encounter: Payer: Self-pay | Admitting: Pulmonary Disease

## 2014-04-22 VITALS — BP 130/70 | HR 60 | Ht 69.5 in | Wt 168.5 lb

## 2014-04-22 DIAGNOSIS — D869 Sarcoidosis, unspecified: Secondary | ICD-10-CM

## 2014-04-22 DIAGNOSIS — Z23 Encounter for immunization: Secondary | ICD-10-CM

## 2014-04-22 DIAGNOSIS — G4733 Obstructive sleep apnea (adult) (pediatric): Secondary | ICD-10-CM

## 2014-04-22 LAB — CBC WITH DIFFERENTIAL/PLATELET
Basophils Absolute: 0.1 10*3/uL (ref 0.0–0.1)
Basophils Relative: 1 % (ref 0–1)
Eosinophils Absolute: 0.1 10*3/uL (ref 0.0–0.7)
Eosinophils Relative: 2 % (ref 0–5)
HCT: 40.2 % (ref 39.0–52.0)
Hemoglobin: 13.3 g/dL (ref 13.0–17.0)
Lymphocytes Relative: 36 % (ref 12–46)
Lymphs Abs: 2.3 10*3/uL (ref 0.7–4.0)
MCH: 27.6 pg (ref 26.0–34.0)
MCHC: 33.1 g/dL (ref 30.0–36.0)
MCV: 83.4 fL (ref 78.0–100.0)
Monocytes Absolute: 0.9 10*3/uL (ref 0.1–1.0)
Monocytes Relative: 14 % — ABNORMAL HIGH (ref 3–12)
Neutro Abs: 3 10*3/uL (ref 1.7–7.7)
Neutrophils Relative %: 47 % (ref 43–77)
Platelets: 253 10*3/uL (ref 150–400)
RBC: 4.82 MIL/uL (ref 4.22–5.81)
RDW: 13.7 % (ref 11.5–15.5)
WBC: 6.3 10*3/uL (ref 4.0–10.5)

## 2014-04-22 LAB — BASIC METABOLIC PANEL
BUN: 16 mg/dL (ref 6–23)
CALCIUM: 9.6 mg/dL (ref 8.4–10.5)
CO2: 30 meq/L (ref 19–32)
CREATININE: 1.2 mg/dL (ref 0.4–1.5)
Chloride: 105 mEq/L (ref 96–112)
GFR: 85.69 mL/min (ref >60.00)
GLUCOSE: 77 mg/dL (ref 70–99)
Potassium: 4.1 mEq/L (ref 3.5–5.1)
SODIUM: 140 meq/L (ref 135–145)

## 2014-04-22 NOTE — Progress Notes (Signed)
   Subjective:    Patient ID: Ryan Horton, male    DOB: Apr 08, 1976, 38 y.o.   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   Significant tests/ events  Presented 09/2010 with Bilateral hilar and mediastinal adenopathy. Slightly nodular appearing infiltrate identified in the left upper lobe.,weakness. for 1.5 months,ankle swelling in nov '11, redness of eyes, loss of appetite, 'sick'- flu like weakness towards evening  Labs >> ANA neg, ESR 20, RA neg , ACE 73  TBBx -NOn caseating granulomas   jan'2008 - Spirometry nml  PFTs 2012 >> mild restriction FVC & TLC 74%, DLCO 82 %   Started on 20 mg prednisone 09/2010, stopped 09/2012  PFTs 03/2013 - FVC improved 4.30 -91%, TLC, 78% - 6.72, DLCO 31 -78%   04/22/2014  Chief Complaint  Patient presents with  . Follow-up    Pt reports breathing has been doing fine. no complaints today   Changing to office work Breathing ok, no skin lesions Wife has c/o snoring & witnessed apneas, ESS 5 Bedtime 10p-MN, minimal sleep latency, 2-3 awakenings, oob by 0600, no excessive coffee  Labs reviewed -ok CXR -no LNs  Review of Systems neg for any significant sore throat, dysphagia, itching, sneezing, nasal congestion or excess/ purulent secretions, fever, chills, sweats, unintended wt loss, pleuritic or exertional cp, hempoptysis, orthopnea pnd or change in chronic leg swelling. Also denies presyncope, palpitations, heartburn, abdominal pain, nausea, vomiting, diarrhea or change in bowel or urinary habits, dysuria,hematuria, rash, arthralgias, visual complaints, headache, numbness weakness or ataxia.     Objective:   Physical Exam  Gen. Pleasant, well-nourished, in no distress ENT - no lesions, no post nasal drip Neck: No JVD, no thyromegaly, no carotid bruits Lungs: no use of accessory muscles, no dullness to percussion, clear without rales or rhonchi  Cardiovascular: Rhythm regular, heart sounds  normal, no murmurs or  gallops, no peripheral edema Musculoskeletal: No deformities, no cyanosis or clubbing        Assessment & Plan:

## 2014-04-22 NOTE — Patient Instructions (Signed)
CXR & blood work today Home sleep study

## 2014-04-23 DIAGNOSIS — G4733 Obstructive sleep apnea (adult) (pediatric): Secondary | ICD-10-CM | POA: Insufficient documentation

## 2014-04-23 LAB — ANGIOTENSIN CONVERTING ENZYME: Angiotensin-Converting Enzyme: 86 U/L — ABNORMAL HIGH (ref 8–52)

## 2014-04-23 NOTE — Assessment & Plan Note (Signed)
Given narrow pharyngeal exam, witnessed apneas & loud snoring, obstructive sleep apnea is very likely & an overnight polysomnogram will be scheduled as a home study. The pathophysiology of obstructive sleep apnea , it's cardiovascular consequences & modes of treatment including CPAP were discused with the patient in detail & they evidenced understanding.  

## 2014-04-23 NOTE — Assessment & Plan Note (Signed)
Appears in remission 

## 2014-06-09 DIAGNOSIS — G4733 Obstructive sleep apnea (adult) (pediatric): Secondary | ICD-10-CM

## 2014-06-10 ENCOUNTER — Telehealth: Payer: Self-pay | Admitting: Pulmonary Disease

## 2014-06-10 DIAGNOSIS — G4733 Obstructive sleep apnea (adult) (pediatric): Secondary | ICD-10-CM

## 2014-06-10 NOTE — Telephone Encounter (Signed)
He does have mild to moderate OSA-stop breathing 15 times per hour. Suggest C Pap titration study if he is willing to proceed with C Pap therapy

## 2014-06-10 NOTE — Telephone Encounter (Signed)
Patient wants to know what causes sleep apnea.  He wants more information about sleep apnea and treatment options before deciding whether or not to do CPAP therapy.

## 2014-06-11 ENCOUNTER — Encounter: Payer: Self-pay | Admitting: Pulmonary Disease

## 2015-02-11 ENCOUNTER — Encounter: Payer: Self-pay | Admitting: Internal Medicine

## 2015-02-11 ENCOUNTER — Ambulatory Visit (INDEPENDENT_AMBULATORY_CARE_PROVIDER_SITE_OTHER): Payer: Commercial Managed Care - HMO | Admitting: Internal Medicine

## 2015-02-11 VITALS — BP 140/92 | HR 90 | Temp 97.2°F | Resp 16 | Ht 70.0 in | Wt 169.0 lb

## 2015-02-11 DIAGNOSIS — B360 Pityriasis versicolor: Secondary | ICD-10-CM | POA: Diagnosis not present

## 2015-02-11 MED ORDER — KETOCONAZOLE 200 MG PO TABS: 200.0000 mg | ORAL_TABLET | Freq: Every day | ORAL | Status: AC

## 2015-02-11 MED ORDER — FLUOCINONIDE-E 0.05 % EX CREA: 1.0000 "application " | TOPICAL_CREAM | Freq: Two times a day (BID) | CUTANEOUS | Status: DC

## 2015-02-11 NOTE — Patient Instructions (Signed)
Tinea Versicolor Tinea versicolor is a common yeast infection of the skin. This condition becomes known when the yeast on our skin starts to overgrow (yeast is a normal inhabitant on our skin). This condition is noticed as white or light brown patches on brown skin, and is more evident in the summer on tanned skin. These areas are slightly scaly if scratched. The light patches from the yeast become evident when the yeast creates "holes in your suntan". This is most often noticed in the summer. The patches are usually located on the chest, back, pubis, neck and body folds. However, it may occur on any area of body. Mild itching and inflammation (redness or soreness) may be present. DIAGNOSIS  The diagnosisof this is made clinically (by looking). Cultures from samples are usually not needed. Examination under the microscope may help. However, yeast is normally found on skin. The diagnosis still remains clinical. Examination under Wood's Ultraviolet Light can determine the extent of the infection. TREATMENT  This common infection is usually only of cosmetic (only a concern to your appearance). It is easily treated with dandruff shampoo used during showers or bathing. Vigorous scrubbing will eliminate the yeast over several days time. The light areas in your skin may remain for weeks or months after the infection is cured unless your skin is exposed to sunlight. The lighter or darker spots caused by the fungus that remain after complete treatment are not a sign of treatment failure; it will take a long time to resolve. Your caregiver may recommend a number of commercial preparations or medication by mouth if home care is not working. Recurrence is common and preventative medication may be necessary. This skin condition is not highly contagious. Special care is not needed to protect close friends and family members. Normal hygiene is usually enough. Follow up is required only if you develop complications (such as a  secondary infection from scratching), if recommended by your caregiver, or if no relief is obtained from the preparations used. Document Released: 06/17/2000 Document Revised: 09/12/2011 Document Reviewed: 07/30/2008 [employer] Patient Information 2015 [employer]. This information is not intended to replace advice given to you by your health care provider. Make sure you discuss any questions you have with your health care provider.  

## 2015-02-11 NOTE — Progress Notes (Signed)
Pre visit review using our clinic review tool, if applicable. No additional management support is needed unless otherwise documented below in the visit note. 

## 2015-02-12 NOTE — Progress Notes (Signed)
Subjective:  Patient ID: Ryan Horton, male    DOB: [birth year]  Age: 39 y.o. MRN: [id number]  CC: Rash   HPI Ryan Horton presents for an itchy rash on his torso for 3 weeks. He has had this before and feels like it crops up just about every summer. This year it's more severe than others with a rash on his lower neck, across his right middle back, across his anterior chest and even some in the groin.  No outpatient prescriptions prior to visit.   No facility-administered medications prior to visit.    ROS Review of Systems  Constitutional: Negative.  Negative for fever, chills, diaphoresis, appetite change and fatigue.  HENT: Negative.   Eyes: Negative.   Respiratory: Negative.  Negative for cough, choking, chest tightness, shortness of breath and stridor.   Cardiovascular: Negative.  Negative for chest pain, palpitations and leg swelling.  Gastrointestinal: Negative.  Negative for nausea, vomiting, abdominal pain, diarrhea, constipation and blood in stool.  Endocrine: Negative.   Genitourinary: Negative.  Negative for difficulty urinating.  Musculoskeletal: Negative.   Skin: Positive for rash. Negative for color change, pallor and wound.  Allergic/Immunologic: Negative.   Neurological: Negative.  Negative for dizziness, tremors, weakness, light-headedness, numbness and headaches.  Hematological: Negative.  Negative for adenopathy. Does not bruise/bleed easily.  Psychiatric/Behavioral: Negative.     Objective:  BP 140/92 mmHg  Pulse 90  Temp(Src) 97.2 F (36.2 C) (Oral)  Resp 16  Ht  (1.778 m)  Wt 169 lb (76.658 kg)  BMI 24.25 kg/m2  SpO2 96%  BP Readings from Last 3 Encounters:  02/11/15 140/92  04/22/14 130/70  03/18/13 112/58    Wt Readings from Last 3 Encounters:  02/11/15 169 lb (76.658 kg)  04/22/14 168 lb 8 oz (76.431 kg)  03/18/13 159 lb (72.122 kg)    Physical Exam  Constitutional: He is oriented to person, place, and time. He appears  well-developed and well-nourished. No distress.  HENT:  Mouth/Throat: Oropharynx is clear and moist. No oropharyngeal exudate.  Eyes: Conjunctivae are normal. Right eye exhibits no discharge. Left eye exhibits no discharge. No scleral icterus.  Neck: Normal range of motion. Neck supple. No JVD present. No tracheal deviation present. No thyromegaly present.  Cardiovascular: Normal rate, regular rhythm, normal heart sounds and intact distal pulses.  Exam reveals no gallop and no friction rub.   No murmur heard. Pulmonary/Chest: Effort normal and breath sounds normal. No stridor. No respiratory distress. He has no wheezes. He has no rales. He exhibits no tenderness.  Abdominal: Soft. Bowel sounds are normal. He exhibits no distension and no mass. There is no tenderness. There is no rebound and no guarding. Hernia confirmed negative in the right inguinal area and confirmed negative in the left inguinal area.  Genitourinary: Testes normal and penis normal. Right testis shows no mass, no swelling and no tenderness. Right testis is descended. Left testis shows no mass, no swelling and no tenderness. Left testis is descended. Circumcised. No penile erythema or penile tenderness. No discharge found.  Musculoskeletal: Normal range of motion. He exhibits no edema or tenderness.  Lymphadenopathy:    He has no cervical adenopathy.       Right: No inguinal adenopathy present.       Left: No inguinal adenopathy present.  Neurological: He is oriented to person, place, and time.  Skin: Skin is warm, dry and intact. Rash noted. No abrasion, no bruising, no burn, no ecchymosis, no laceration, no lesion,  no petechiae and no purpura noted. Rash is macular. Rash is not papular, not maculopapular, not nodular, not pustular, not vesicular and not urticarial. He is not diaphoretic. No erythema. No pallor.     Across his back and torso as well as in the groin there are round to polygonal macules that measure from 1 cm up  to 3 cm. They have a shiny, shimmery appearance but no scale or papules. There are no plaques. These lesions are mildly de-pigmented. Over the scrotum there is erythema, scale, slightly leathery appearance.  Psychiatric: He has a normal mood and affect. His behavior is normal. Judgment and thought content normal.    Lab Results  Component Value Date   WBC 6.3 04/22/2014   HGB 13.3 04/22/2014   HCT 40.2 04/22/2014   PLT 253 04/22/2014   GLUCOSE 77 04/22/2014   CHOL 202* 10/27/2010   TRIG 40.0 10/27/2010   HDL 77.20 10/27/2010   LDLDIRECT 114.5 10/27/2010   ALT 30 01/31/2013   AST 38* 01/31/2013   NA 140 04/22/2014   K 4.1 04/22/2014   CL 105 04/22/2014   CREATININE 1.2 04/22/2014   BUN 16 04/22/2014   CO2 30 04/22/2014   TSH 1.13 01/31/2013    Dg Chest 2 View  04/23/2014   CLINICAL DATA:  History of sarcoidosis, followup  EXAM: CHEST  2 VIEW  COMPARISON:  Chest x-ray of 05/09/2012  FINDINGS: No active infiltrate or effusion is seen. Mediastinal hilar contours are unremarkable with no evidence of adenopathy in this patient with history of sarcoidosis. The heart is within normal limits in size. No bony abnormality is seen.  IMPRESSION: No active cardiopulmonary disease.   Electronically Signed   By: Dwyane Dee M.D.   On: 04/23/2014 08:25    Assessment & Plan:   Ryan Horton was seen today for rash.  Diagnoses and all orders for this visit:  Tinea versicolor- he has widespread involvement that I don't think would respond well to a topical treatment, will therefore treat with a 3 day course of systemic ketoconazole. He also has itching related to this so will start Lidex emollient cream. For itching he will also start over-the-counter doses of Zyrtec as needed. -     ketoconazole (NIZORAL) 200 MG tablet; Take 1 tablet (200 mg total) by mouth daily. -     fluocinonide-emollient (LIDEX-E) 0.05 % cream; Apply 1 application topically 2 (two) times daily.   I am having Ryan Horton start on  ketoconazole and fluocinonide-emollient.  Meds ordered this encounter  Medications  . ketoconazole (NIZORAL) 200 MG tablet    Sig: Take 1 tablet (200 mg total) by mouth daily.    Dispense:  3 tablet    Refill:  0  . fluocinonide-emollient (LIDEX-E) 0.05 % cream    Sig: Apply 1 application topically 2 (two) times daily.    Dispense:  60 g    Refill:  1     Follow-up: Return in about 4 weeks (around 03/11/2015).  Sanda Linger, MD

## 2015-12-21 ENCOUNTER — Ambulatory Visit (INDEPENDENT_AMBULATORY_CARE_PROVIDER_SITE_OTHER): Payer: Commercial Managed Care - HMO | Admitting: Family

## 2015-12-21 ENCOUNTER — Encounter: Payer: Self-pay | Admitting: Family

## 2015-12-21 VITALS — BP 118/62 | HR 69 | Temp 98.3°F | Ht 70.0 in | Wt 165.2 lb

## 2015-12-21 DIAGNOSIS — B029 Zoster without complications: Secondary | ICD-10-CM | POA: Diagnosis not present

## 2015-12-21 MED ORDER — VALACYCLOVIR HCL 1 G PO TABS: 1000.0000 mg | ORAL_TABLET | Freq: Three times a day (TID) | ORAL | Status: DC

## 2015-12-21 NOTE — Progress Notes (Signed)
Pre visit review using our clinic review tool, if applicable. No additional management support is needed unless otherwise documented below in the visit note. 

## 2015-12-21 NOTE — Progress Notes (Signed)
   Subjective:    Patient ID: Ryan Horton, male    DOB: 1975-08-31, 40 y.o.   MRN: [id number]   Ryan Horton is a 40 y.o. male who presents today for an acute visit.    HPI Comments: Patient here for blistering, tingling, painful rash started 3 days ago, worsening. Rash is over right chest. Had chickenpox as a child. No contact with poison ivy or ticks. No fever, chills, nausea, vomiting.  No past medical history on file. Allergies: Other No current outpatient prescriptions on file prior to visit.   No current facility-administered medications on file prior to visit.    Social History  Substance Use Topics  . Smoking status: Never Smoker   . Smokeless tobacco: Never Used  . Alcohol Use: 1.2 oz/week    2 Cans of beer per week    Review of Systems  Constitutional: Negative for fever and chills.  Respiratory: Negative for cough.   Cardiovascular: Negative for chest pain and palpitations.  Gastrointestinal: Negative for nausea and vomiting.  Musculoskeletal: Negative for arthralgias.  Skin: Positive for rash.      Objective:    BP 118/62 mmHg  Pulse 69  Temp(Src) 98.3 F (36.8 C) (Oral)  Ht 5\' 10"  (1.778 m)  Wt 165 lb 4 oz (74.957 kg)  BMI 23.71 kg/m2  SpO2 97%   Physical Exam  Constitutional: He appears well-developed and well-nourished.  Cardiovascular: Regular rhythm and normal heart sounds.   Pulmonary/Chest: Effort normal and breath sounds normal. No respiratory distress. He has no wheezes. He has no rhonchi. He has no rales.  Lymphadenopathy:       Head (left side): No submandibular and no preauricular adenopathy present.  Neurological: He is alert.  Skin: Skin is warm and dry. Rash noted. Rash is vesicular.     Psychiatric: He has a normal mood and affect. His speech is normal and behavior is normal.  Vitals reviewed.      Assessment & Plan:  1. Herpes Zoster.  - valACYclovir (VALTREX) 1000 MG tablet; Take 1 tablet (1,000 mg total) by mouth 3  (three) times daily.  Dispense: 21 tablet; Refill: 0     I have discontinued Ryan Horton's fluocinonide-emollient. I am also having him start on valACYclovir.   Meds ordered this encounter  Medications  . valACYclovir (VALTREX) 1000 MG tablet    Sig: Take 1 tablet (1,000 mg total) by mouth 3 (three) times daily.    Dispense:  21 tablet    Refill:  0    Order Specific Question:  Supervising Provider    Answer:  Tresa GarterPLOTNIKOV, ALEKSEI V [[id number]]     Start medications as prescribed and explained to patient on After Visit Summary ( AVS). Risks, benefits, and alternatives of the medications and treatment plan prescribed today were discussed, and patient expressed understanding.   Education regarding symptom management and diagnosis given to patient.   Follow-up:Plan follow-up and return precautions given if any worsening symptoms or change in condition.   Continue to follow with Sanda Lingerhomas Jones, MD for routine health maintenance.   Ryan Horton and I agreed with plan.   [name], FNP

## 2015-12-21 NOTE — Patient Instructions (Signed)
If there is no improvement in your symptoms, or if there is any worsening of symptoms, or if you have any additional concerns, please return for re-evaluation; or, if we are closed, consider going to the Emergency Room for evaluation if symptoms urgent.  Shingles Shingles, which is also known as herpes zoster, is an infection that causes a painful skin rash and fluid-filled blisters. Shingles is not related to genital herpes, which is a sexually transmitted infection.   Shingles only develops in people who:  Have had chickenpox.  Have received the chickenpox vaccine. (This is rare.) CAUSES Shingles is caused by varicella-zoster virus (VZV). This is the same virus that causes chickenpox. After exposure to VZV, the virus stays in the body in an inactive (dormant) state. Shingles develops if the virus reactivates. This can happen many years after the initial exposure to VZV. It is not known what causes this virus to reactivate. RISK FACTORS People who have had chickenpox or received the chickenpox vaccine are at risk for shingles. Infection is more common in people who:  Are older than age 39.  Have a weakened defense (immune) system, such as those with HIV, AIDS, or cancer.  Are taking medicines that weaken the immune system, such as transplant medicines.  Are under great stress. SYMPTOMS Early symptoms of this condition include itching, tingling, and pain in an area on your skin. Pain may be described as burning, stabbing, or throbbing. A few days or weeks after symptoms start, a painful red rash appears, usually on one side of the body in a bandlike or beltlike pattern. The rash eventually turns into fluid-filled blisters that break open, scab over, and dry up in about 2-3 weeks. At any time during the infection, you may also develop:  A fever.  Chills.  A headache.  An upset stomach. DIAGNOSIS This condition is diagnosed with a skin exam. Sometimes, skin or fluid samples are taken  from the blisters before a diagnosis is made. These samples are examined under a microscope or sent to a lab for testing. TREATMENT There is no specific cure for this condition. Your health care provider will probably prescribe medicines to help you manage pain, recover more quickly, and avoid long-term problems. Medicines may include:  Antiviral drugs.  Anti-inflammatory drugs.  Pain medicines. If the area involved is on your face, you may be referred to a specialist, such as an eye doctor (ophthalmologist) or an ear, nose, and throat (ENT) doctor to help you avoid eye problems, chronic pain, or disability. HOME CARE INSTRUCTIONS Medicines  Take medicines only as directed by your health care provider.  Apply an anti-itch or numbing cream to the affected area as directed by your health care provider. Blister and Rash Care  Take a cool bath or apply cool compresses to the area of the rash or blisters as directed by your health care provider. This may help with pain and itching.  Keep your rash covered with a loose bandage (dressing). Wear loose-fitting clothing to help ease the pain of material rubbing against the rash.  Keep your rash and blisters clean with mild soap and cool water or as directed by your health care provider.  Check your rash every day for signs of infection. These include redness, swelling, and pain that lasts or increases.  Do not pick your blisters.  Do not scratch your rash. General Instructions  Rest as directed by your health care provider.  Keep all follow-up visits as directed by your health care provider.  This is important.  Until your blisters scab over, your infection can cause chickenpox in people who have never had it or been vaccinated against it. To prevent this from happening, avoid contact with other people, especially:  Babies.  Pregnant women.  Children who have eczema.  Elderly people who have transplants.  People who have chronic  illnesses, such as leukemia or AIDS. SEEK MEDICAL CARE IF:  Your pain is not relieved with prescribed medicines.  Your pain does not get better after the rash heals.  Your rash looks infected. Signs of infection include redness, swelling, and pain that lasts or increases. SEEK IMMEDIATE MEDICAL CARE IF:  The rash is on your face or nose.  You have facial pain, pain around your eye area, or loss of feeling on one side of your face.  You have ear pain or you have ringing in your ear.  You have loss of taste.  Your condition gets worse.   This information is not intended to replace advice given to you by your health care provider. Make sure you discuss any questions you have with your health care provider.   Document Released: 06/20/2005 Document Revised: 07/11/2014 Document Reviewed: 05/01/2014 Elsevier Interactive Patient Education [employer]2016 [employer]

## 2016-12-29 ENCOUNTER — Ambulatory Visit (INDEPENDENT_AMBULATORY_CARE_PROVIDER_SITE_OTHER): Payer: 59 | Admitting: Nurse Practitioner

## 2016-12-29 ENCOUNTER — Encounter: Payer: Self-pay | Admitting: Nurse Practitioner

## 2016-12-29 VITALS — BP 124/88 | HR 81 | Temp 98.6°F | Ht 70.0 in | Wt 163.0 lb

## 2016-12-29 DIAGNOSIS — R202 Paresthesia of skin: Secondary | ICD-10-CM | POA: Diagnosis not present

## 2016-12-29 DIAGNOSIS — M25551 Pain in right hip: Secondary | ICD-10-CM

## 2016-12-29 NOTE — Patient Instructions (Signed)
Continue use of ibuprofen as needed.  Provided printed letter for employer to switch to lighter firearm.  Consider hip x-ray if no improvement.

## 2016-12-29 NOTE — Progress Notes (Signed)
Subjective:  Patient ID: Ryan Horton, male    DOB: 14-Dec-1975  Age: 41 y.o. MRN: [id number]  CC: Follow-up (right hip pain,numbness down to toes--due to gun he has to carry during work hours/ department can get lighter weapon but need doctor note stating why he need it. )   Hip Pain   The incident occurred more than 1 week ago. The incident occurred at work. The injury mechanism was a compression (pressure of firearm). The pain is present in the right thigh. The quality of the pain is described as aching, burning and shooting. The pain is moderate. The pain has been fluctuating since onset. Associated symptoms include numbness and tingling. Pertinent negatives include no inability to bear weight, loss of motion, loss of sensation or muscle weakness. Exacerbated by: wearing firearm on right hip. He has tried NSAIDs and rest for the symptoms. The treatment provided mild relief.   Right hip pain and numbness with wearing gun on right hip.  Current gun weighs 2.8Lbs. Ongoing for more than 65month. Numbness last for about 1hr after removal of gun.  Outpatient Medications Prior to Visit  Medication Sig Dispense Refill  . valACYclovir (VALTREX) 1000 MG tablet Take 1 tablet (1,000 mg total) by mouth 3 (three) times daily. (Patient not taking: Reported on 12/29/2016) 21 tablet 0   No facility-administered medications prior to visit.     ROS Review of Systems  Respiratory: Negative.   Cardiovascular: Negative.   Gastrointestinal: Negative for abdominal pain, constipation and diarrhea.  Genitourinary: Negative for dysuria, flank pain, frequency and urgency.  Musculoskeletal: Positive for joint pain. Negative for back pain, falls and myalgias.  Skin: Negative.   Neurological: Positive for tingling, sensory change and numbness. Negative for focal weakness.  Endo/Heme/Allergies: Does not bruise/bleed easily.    Objective:  BP 124/88   Pulse 81   Temp 98.6 F (37 C)   Ht 5\' 10"  (1.778 m)    Wt 163 lb (73.9 kg)   SpO2 99%   BMI 23.39 kg/m   BP Readings from Last 3 Encounters:  12/29/16 124/88  12/21/15 118/62  02/11/15 (!) 140/92    Wt Readings from Last 3 Encounters:  12/29/16 163 lb (73.9 kg)  12/21/15 165 lb 4 oz (75 kg)  02/11/15 169 lb (76.7 kg)    Physical Exam  Constitutional: He is oriented to person, place, and time.  Cardiovascular: Normal rate.   Pulmonary/Chest: Effort normal.  Musculoskeletal: He exhibits no edema, tenderness or deformity.  Negative straight leg raise. No tenderness over trochanter bursa.  Neurological: He is alert and oriented to person, place, and time. He has normal reflexes. Coordination normal.  Normal gait/  Skin: Skin is warm and dry.  Vitals reviewed.   Lab Results  Component Value Date   WBC 6.3 04/22/2014   HGB 13.3 04/22/2014   HCT 40.2 04/22/2014   PLT 253 04/22/2014   GLUCOSE 77 04/22/2014   CHOL 202 (H) 10/27/2010   TRIG 40.0 10/27/2010   HDL 77.20 10/27/2010   LDLDIRECT 114.5 10/27/2010   ALT 30 01/31/2013   AST 38 (H) 01/31/2013   NA 140 04/22/2014   K 4.1 04/22/2014   CL 105 04/22/2014   CREATININE 1.2 04/22/2014   BUN 16 04/22/2014   CO2 30 04/22/2014   TSH 1.13 01/31/2013    Dg Chest 2 View  Result Date: 04/23/2014 CLINICAL DATA:  History of sarcoidosis, followup EXAM: CHEST  2 VIEW COMPARISON:  Chest x-ray of 05/09/2012 FINDINGS: No  active infiltrate or effusion is seen. Mediastinal hilar contours are unremarkable with no evidence of adenopathy in this patient with history of sarcoidosis. The heart is within normal limits in size. No bony abnormality is seen. IMPRESSION: No active cardiopulmonary disease. Electronically Signed   By: Dwyane Dee M.D.   On: 04/23/2014 08:25    Assessment & Plan:   Ryan Horton was seen today for follow-up.  Diagnoses and all orders for this visit:  Acute right hip pain  Right leg paresthesias   I am having Ryan Horton maintain his valACYclovir.  No orders of  the defined types were placed in this encounter.   Follow-up: No Follow-up on file.  Alysia Penna, NP

## 2017-05-11 ENCOUNTER — Ambulatory Visit (INDEPENDENT_AMBULATORY_CARE_PROVIDER_SITE_OTHER): Payer: 59 | Admitting: Internal Medicine

## 2017-05-11 ENCOUNTER — Encounter: Payer: Self-pay | Admitting: Internal Medicine

## 2017-05-11 ENCOUNTER — Other Ambulatory Visit (INDEPENDENT_AMBULATORY_CARE_PROVIDER_SITE_OTHER): Payer: 59

## 2017-05-11 VITALS — BP 142/94 | HR 70 | Temp 98.1°F | Resp 16 | Ht 70.0 in | Wt 161.0 lb

## 2017-05-11 DIAGNOSIS — Z0000 Encounter for general adult medical examination without abnormal findings: Secondary | ICD-10-CM

## 2017-05-11 DIAGNOSIS — Z23 Encounter for immunization: Secondary | ICD-10-CM | POA: Diagnosis not present

## 2017-05-11 DIAGNOSIS — I10 Essential (primary) hypertension: Secondary | ICD-10-CM | POA: Insufficient documentation

## 2017-05-11 DIAGNOSIS — M5440 Lumbago with sciatica, unspecified side: Secondary | ICD-10-CM

## 2017-05-11 LAB — CBC WITH DIFFERENTIAL/PLATELET
BASOS ABS: 0.1 10*3/uL (ref 0.0–0.1)
Basophils Relative: 1 % (ref 0.0–3.0)
EOS PCT: 1.8 % (ref 0.0–5.0)
Eosinophils Absolute: 0.1 10*3/uL (ref 0.0–0.7)
HEMATOCRIT: 46.1 % (ref 39.0–52.0)
HEMOGLOBIN: 14.9 g/dL (ref 13.0–17.0)
LYMPHS PCT: 27.5 % (ref 12.0–46.0)
Lymphs Abs: 1.5 10*3/uL (ref 0.7–4.0)
MCHC: 32.3 g/dL (ref 30.0–36.0)
MCV: 87.4 fl (ref 78.0–100.0)
MONOS PCT: 10.5 % (ref 3.0–12.0)
Monocytes Absolute: 0.6 10*3/uL (ref 0.1–1.0)
Neutro Abs: 3.2 10*3/uL (ref 1.4–7.7)
Neutrophils Relative %: 59.2 % (ref 43.0–77.0)
Platelets: 257 10*3/uL (ref 150.0–400.0)
RBC: 5.27 Mil/uL (ref 4.22–5.81)
RDW: 13.3 % (ref 11.5–15.5)
WBC: 5.4 10*3/uL (ref 4.0–10.5)

## 2017-05-11 LAB — COMPREHENSIVE METABOLIC PANEL
ALK PHOS: 56 U/L (ref 39–117)
ALT: 11 U/L (ref 0–53)
AST: 19 U/L (ref 0–37)
Albumin: 5 g/dL (ref 3.5–5.2)
BILIRUBIN TOTAL: 1.7 mg/dL — AB (ref 0.2–1.2)
BUN: 17 mg/dL (ref 6–23)
CO2: 31 mEq/L (ref 19–32)
CREATININE: 1.23 mg/dL (ref 0.40–1.50)
Calcium: 10.6 mg/dL — ABNORMAL HIGH (ref 8.4–10.5)
Chloride: 101 mEq/L (ref 96–112)
GFR: 83.55 mL/min (ref >60.00)
GLUCOSE: 91 mg/dL (ref 70–99)
Potassium: 4.4 mEq/L (ref 3.5–5.1)
Sodium: 139 mEq/L (ref 135–145)
TOTAL PROTEIN: 8.3 g/dL (ref 6.0–8.3)

## 2017-05-11 LAB — LIPID PANEL
Cholesterol: 224 mg/dL — ABNORMAL HIGH (ref 0–200)
HDL: 63.2 mg/dL (ref >39.00)
LDL Cholesterol: 151 mg/dL — ABNORMAL HIGH (ref 0–99)
NONHDL: 160.71
Total CHOL/HDL Ratio: 4
Triglycerides: 49 mg/dL (ref 0.0–149.0)
VLDL: 9.8 mg/dL (ref 0.0–40.0)

## 2017-05-11 LAB — SEDIMENTATION RATE: Sed Rate: 25 mm/hr — ABNORMAL HIGH (ref 0–15)

## 2017-05-11 LAB — TSH: TSH: 1.59 u[IU]/mL (ref 0.35–4.50)

## 2017-05-11 LAB — PSA: PSA: 1.1 ng/mL (ref 0.10–4.00)

## 2017-05-11 NOTE — Patient Instructions (Signed)

## 2017-05-11 NOTE — Progress Notes (Signed)
Subjective:  Patient ID: Ryan LessMilford J Shavers, male    DOB: [birth year]  Age: 41 y.o. MRN: [id number]  CC: Annual Exam; Hypertension; and Back Pain   HPI Ryan Horton presents for a CPX.  He complains of a 4-week history of intermittent low back pain.  He takes Aleve to get symptom relief.  The back pain is nonradiating and he denies numbness/weakness/tingling in his lower extremities.  He otherwise feels well and offers no systemic complaints.  Outpatient Medications Prior to Visit  Medication Sig Dispense Refill  . valACYclovir (VALTREX) 1000 MG tablet Take 1 tablet (1,000 mg total) by mouth 3 (three) times daily. (Patient not taking: Reported on 12/29/2016) 21 tablet 0   No facility-administered medications prior to visit.     ROS Review of Systems  Constitutional: Negative.  Negative for appetite change, diaphoresis, fatigue and unexpected weight change.  HENT: Negative.   Eyes: Negative.  Negative for visual disturbance.  Respiratory: Negative for cough, chest tightness, shortness of breath and wheezing.   Cardiovascular: Negative for chest pain, palpitations and leg swelling.  Gastrointestinal: Negative for abdominal pain, constipation, diarrhea, nausea and vomiting.  Endocrine: Negative.   Genitourinary: Negative.  Negative for difficulty urinating, dysuria, penile swelling, scrotal swelling, testicular pain and urgency.  Musculoskeletal: Positive for back pain. Negative for joint swelling, myalgias and neck pain.  Skin: Negative.  Negative for color change and rash.  Allergic/Immunologic: Negative.   Neurological: Negative for dizziness, weakness, light-headedness, numbness and headaches.  Hematological: Negative for adenopathy. Does not bruise/bleed easily.  Psychiatric/Behavioral: Negative.     Objective:  BP (!) 142/94 (BP Location: Left Arm, Patient Position: Sitting, Cuff Size: Normal)   Pulse 70   Temp 98.1 F (36.7 C) (Oral)   Resp 16   Ht 5\' 10"  (1.778 m)    Wt 161 lb (73 kg)   SpO2 96%   BMI 23.10 kg/m   BP Readings from Last 3 Encounters:  05/11/17 (!) 142/94  12/29/16 124/88  12/21/15 118/62    Wt Readings from Last 3 Encounters:  05/11/17 161 lb (73 kg)  12/29/16 163 lb (73.9 kg)  12/21/15 165 lb 4 oz (75 kg)    Physical Exam  Constitutional: He is oriented to person, place, and time. No distress.  HENT:  Mouth/Throat: Oropharynx is clear and moist. No oropharyngeal exudate.  Eyes: Conjunctivae are normal. Right eye exhibits no discharge. Left eye exhibits no discharge. No scleral icterus.  Neck: Normal range of motion. Neck supple. No JVD present. No thyromegaly present.  Cardiovascular: Normal rate, regular rhythm and intact distal pulses. Exam reveals no gallop and no friction rub.  No murmur heard. Pulmonary/Chest: Effort normal and breath sounds normal. No respiratory distress. He has no wheezes. He has no rales. He exhibits no tenderness.  Abdominal: Soft. Bowel sounds are normal. He exhibits no distension and no mass. There is no tenderness. There is no rebound and no guarding. Hernia confirmed negative in the right inguinal area and confirmed negative in the left inguinal area.  Genitourinary: Rectum normal, prostate normal, testes normal and penis normal. Rectal exam shows no external hemorrhoid, no internal hemorrhoid, no fissure, no mass, anal tone normal and guaiac negative stool. Prostate is not enlarged and not tender. Right testis shows no mass, no swelling and no tenderness. Right testis is descended. Left testis shows no mass, no swelling and no tenderness. Left testis is descended. Circumcised. No penile erythema or penile tenderness. No discharge found.  Musculoskeletal: Normal range  of motion. He exhibits no edema, tenderness or deformity.       Lumbar back: Normal. He exhibits normal range of motion, no tenderness, no bony tenderness, no swelling, no edema and no deformity.  Lymphadenopathy:       Right: No  inguinal adenopathy present.       Left: No inguinal adenopathy present.  Neurological: He is alert and oriented to person, place, and time.  Skin: Skin is warm and dry. No rash noted. He is not diaphoretic. No erythema. No pallor.  Vitals reviewed.   Lab Results  Component Value Date   WBC 5.4 05/11/2017   HGB 14.9 05/11/2017   HCT 46.1 05/11/2017   PLT 257.0 05/11/2017   GLUCOSE 91 05/11/2017   CHOL 224 (H) 05/11/2017   TRIG 49.0 05/11/2017   HDL 63.20 05/11/2017   LDLDIRECT 114.5 10/27/2010   LDLCALC 151 (H) 05/11/2017   ALT 11 05/11/2017   AST 19 05/11/2017   NA 139 05/11/2017   K 4.4 05/11/2017   CL 101 05/11/2017   CREATININE 1.23 05/11/2017   BUN 17 05/11/2017   CO2 31 05/11/2017   TSH 1.59 05/11/2017   PSA 1.10 05/11/2017    Dg Chest 2 View  Result Date: 04/23/2014 CLINICAL DATA:  History of sarcoidosis, followup EXAM: CHEST  2 VIEW COMPARISON:  Chest x-ray of 05/09/2012 FINDINGS: No active infiltrate or effusion is seen. Mediastinal hilar contours are unremarkable with no evidence of adenopathy in this patient with history of sarcoidosis. The heart is within normal limits in size. No bony abnormality is seen. IMPRESSION: No active cardiopulmonary disease. Electronically Signed   By: Dwyane DeePaul  Barry M.D.   On: 04/23/2014 08:25    Assessment & Plan:   Wende MottMilford was seen today for annual exam, hypertension and back pain.  Diagnoses and all orders for this visit:  Need for influenza vaccination -     Flu Vaccine QUAD 36+ mos IM  Routine general medical examination at a health care facility- exam completed, labs reviewed, vaccines reviewed and updated, patient education material was given. -     Lipid panel; Future -     Comprehensive metabolic panel; Future -     CBC with Differential/Platelet; Future -     PSA; Future -     TSH; Future  Essential hypertension- He has stage I hypertension.  His labs are negative for any secondary causes or endorgan damage.  He does  not want to start taking an antihypertensive.  He will improve his lifestyle modifications.  Acute right-sided low back pain with sciatica, sciatica laterality unspecified- His examination is reassuring and he is neurologically intact.  The other labs are within normal limits. The pain is consistent with musculoskeletal causes and he will continue to take Aleve as needed for the discomfort. -     Sedimentation rate; Future   I am having Ryan LessMilford J. Privott maintain his valACYclovir.  No orders of the defined types were placed in this encounter.    Follow-up: No Follow-up on file.  Sanda Lingerhomas Jones, MD

## 2017-06-22 ENCOUNTER — Ambulatory Visit: Payer: 59 | Admitting: Internal Medicine

## 2017-06-22 ENCOUNTER — Encounter: Payer: Self-pay | Admitting: Internal Medicine

## 2017-06-22 VITALS — BP 130/90 | HR 63 | Temp 98.4°F | Resp 16 | Ht 70.0 in | Wt 163.5 lb

## 2017-06-22 DIAGNOSIS — L309 Dermatitis, unspecified: Secondary | ICD-10-CM | POA: Insufficient documentation

## 2017-06-22 MED ORDER — CRISABOROLE 2 % EX OINT: 1.0000 "application " | TOPICAL_OINTMENT | Freq: Two times a day (BID) | CUTANEOUS | 5 refills | Status: DC

## 2017-06-22 MED ORDER — DOXEPIN HCL 10 MG PO CAPS: 10.0000 mg | ORAL_CAPSULE | Freq: Every day | ORAL | 1 refills | Status: DC

## 2017-06-22 NOTE — Patient Instructions (Signed)

## 2017-06-26 NOTE — Progress Notes (Signed)
Subjective:  Patient ID: Ryan Horton, male    DOB: 03/30/1976  Age: 41 y.o. MRN: [id number]  CC: Rash   HPI Ryan Horton presents for a several month history of dry itchy rash on his anterior and posterior torso.  The itching keeps him awake at night.  He has tried a few doses of oral Benadryl without much improvement in his symptoms.  He has not applied anything topically.  Outpatient Medications Prior to Visit  Medication Sig Dispense Refill  . valACYclovir (VALTREX) 1000 MG tablet Take 1 tablet (1,000 mg total) by mouth 3 (three) times daily. (Patient not taking: Reported on 12/29/2016) 21 tablet 0   No facility-administered medications prior to visit.     ROS Review of Systems  Skin: Positive for rash. Negative for color change.  All other systems reviewed and are negative.   Objective:  BP 130/90 (BP Location: Left Arm, Patient Position: Sitting, Cuff Size: Normal)   Pulse 63   Temp 98.4 F (36.9 C) (Oral)   Resp 16   Ht 5\' 10"  (1.778 m)   Wt 163 lb 8 oz (74.2 kg)   SpO2 98%   BMI 23.46 kg/m   BP Readings from Last 3 Encounters:  06/22/17 130/90  05/11/17 (!) 142/94  12/29/16 124/88    Wt Readings from Last 3 Encounters:  06/22/17 163 lb 8 oz (74.2 kg)  05/11/17 161 lb (73 kg)  12/29/16 163 lb (73.9 kg)    Physical Exam  Skin:     Vitals reviewed.   Lab Results  Component Value Date   WBC 5.4 05/11/2017   HGB 14.9 05/11/2017   HCT 46.1 05/11/2017   PLT 257.0 05/11/2017   GLUCOSE 91 05/11/2017   CHOL 224 (H) 05/11/2017   TRIG 49.0 05/11/2017   HDL 63.20 05/11/2017   LDLDIRECT 114.5 10/27/2010   LDLCALC 151 (H) 05/11/2017   ALT 11 05/11/2017   AST 19 05/11/2017   NA 139 05/11/2017   K 4.4 05/11/2017   CL 101 05/11/2017   CREATININE 1.23 05/11/2017   BUN 17 05/11/2017   CO2 31 05/11/2017   TSH 1.59 05/11/2017   PSA 1.10 05/11/2017    Dg Chest 2 View  Result Date: 04/23/2014 CLINICAL DATA:  History of sarcoidosis, followup  EXAM: CHEST  2 VIEW COMPARISON:  Chest x-ray of 05/09/2012 FINDINGS: No active infiltrate or effusion is seen. Mediastinal hilar contours are unremarkable with no evidence of adenopathy in this patient with history of sarcoidosis. The heart is within normal limits in size. No bony abnormality is seen. IMPRESSION: No active cardiopulmonary disease. Electronically Signed   By: Dwyane DeePaul  Barry M.D.   On: 04/23/2014 08:25    Assessment & Plan:   Ryan Horton was seen today for rash.  Diagnoses and all orders for this visit:  Eczema, unspecified type- I will treat the itching with a potent, once daily antihistamine.  Will treat the allergic, inflammatory phenomenon with Saint MartinEucrisa. -     doxepin (SINEQUAN) 10 MG capsule; Take 1 capsule (10 mg total) by mouth at bedtime. -     Crisaborole (EUCRISA) 2 % OINT; Apply 1 application topically 2 (two) times daily.   I have discontinued Ryan Horton's valACYclovir. I am also having him start on doxepin and Crisaborole.  Meds ordered this encounter  Medications  . doxepin (SINEQUAN) 10 MG capsule    Sig: Take 1 capsule (10 mg total) by mouth at bedtime.    Dispense:  90 capsule  Refill:  1  . Crisaborole (EUCRISA) 2 % OINT    Sig: Apply 1 application topically 2 (two) times daily.    Dispense:  60 g    Refill:  5     Follow-up: Return if symptoms worsen or fail to improve.  Sanda Lingerhomas Gordon Vandunk, MD

## 2017-07-12 ENCOUNTER — Ambulatory Visit: Payer: 59 | Admitting: Internal Medicine

## 2017-07-12 ENCOUNTER — Encounter: Payer: Self-pay | Admitting: Internal Medicine

## 2017-07-12 VITALS — BP 130/82 | HR 75 | Temp 98.7°F | Resp 16 | Ht 70.0 in | Wt 166.0 lb

## 2017-07-12 DIAGNOSIS — L309 Dermatitis, unspecified: Secondary | ICD-10-CM | POA: Diagnosis not present

## 2017-07-12 NOTE — Progress Notes (Signed)
   Subjective:  Patient ID: Ryan Horton, male    DOB: September 19, 1975  Age: 42 y.o. MRN: [id number]  CC: Rash   HPI Ryan Horton presents for f/u- the rash has resolved and his skin feels well.  He has had no itching.  He tried Saint MartinEucrisa but says it caused his skin to burn so he has been using his daughter's topical steroid to treat the eczema.  This has been very effective.  He has also stopped taking the antihistamine.  He feels well today and offers no complaints.  Outpatient Medications Prior to Visit  Medication Sig Dispense Refill  . doxepin (SINEQUAN) 10 MG capsule Take 1 capsule (10 mg total) by mouth at bedtime. 90 capsule 1  . Crisaborole (EUCRISA) 2 % OINT Apply 1 application topically 2 (two) times daily. (Patient not taking: Reported on 07/12/2017) 60 g 5   No facility-administered medications prior to visit.     ROS Review of Systems  Skin: Negative for rash.  All other systems reviewed and are negative.   Objective:  BP 130/82 (BP Location: Left Arm, Patient Position: Sitting, Cuff Size: Normal)   Pulse 75   Temp 98.7 F (37.1 C) (Oral)   Resp 16   Ht 5\' 10"  (1.778 m)   Wt 166 lb 0.6 oz (75.3 kg)   SpO2 99%   BMI 23.82 kg/m   BP Readings from Last 3 Encounters:  07/12/17 130/82  06/22/17 130/90  05/11/17 (!) 142/94    Wt Readings from Last 3 Encounters:  07/12/17 166 lb 0.6 oz (75.3 kg)  06/22/17 163 lb 8 oz (74.2 kg)  05/11/17 161 lb (73 kg)    Physical Exam  Skin: Skin is warm and dry. No rash noted. No erythema.    Lab Results  Component Value Date   WBC 5.4 05/11/2017   HGB 14.9 05/11/2017   HCT 46.1 05/11/2017   PLT 257.0 05/11/2017   GLUCOSE 91 05/11/2017   CHOL 224 (H) 05/11/2017   TRIG 49.0 05/11/2017   HDL 63.20 05/11/2017   LDLDIRECT 114.5 10/27/2010   LDLCALC 151 (H) 05/11/2017   ALT 11 05/11/2017   AST 19 05/11/2017   NA 139 05/11/2017   K 4.4 05/11/2017   CL 101 05/11/2017   CREATININE 1.23 05/11/2017   BUN 17 05/11/2017     CO2 31 05/11/2017   TSH 1.59 05/11/2017   PSA 1.10 05/11/2017    Dg Chest 2 View  Result Date: 04/23/2014 CLINICAL DATA:  History of sarcoidosis, followup EXAM: CHEST  2 VIEW COMPARISON:  Chest x-ray of 05/09/2012 FINDINGS: No active infiltrate or effusion is seen. Mediastinal hilar contours are unremarkable with no evidence of adenopathy in this patient with history of sarcoidosis. The heart is within normal limits in size. No bony abnormality is seen. IMPRESSION: No active cardiopulmonary disease. Electronically Signed   By: Dwyane DeePaul  Barry M.D.   On: 04/23/2014 08:25    Assessment & Plan:   Ryan Horton was seen today for rash.  Diagnoses and all orders for this visit:  Eczema, unspecified type- He has responded well to an unspecified topical steroid.  I advised him to continue using that as needed and to let me know if he develops any new symptoms.   I have discontinued Ryan Horton's doxepin and Crisaborole.  No orders of the defined types were placed in this encounter.    Follow-up: Return if symptoms worsen or fail to improve.  Sanda Lingerhomas Jones, MD

## 2017-07-12 NOTE — Patient Instructions (Signed)

## 2017-07-25 ENCOUNTER — Telehealth: Payer: Self-pay | Admitting: Internal Medicine

## 2017-07-25 NOTE — Telephone Encounter (Signed)
Copied from CRM [phone]#[id number]. Topic: Quick Communication - See Telephone Encounter >> Jul 25, 2017  1:19 PM Oneal GroutSebastian, Jennifer S wrote: CRM for notification. See Telephone encounter for: Was told to call back with name of ointment that his daughter uses, Dr Yetta BarreJones told him he would call it in for him, Mometasone Furoate, [facility] [address]  07/25/17.

## 2017-07-26 ENCOUNTER — Other Ambulatory Visit: Payer: Self-pay | Admitting: Internal Medicine

## 2017-07-26 DIAGNOSIS — L309 Dermatitis, unspecified: Secondary | ICD-10-CM

## 2017-07-26 MED ORDER — MOMETASONE FUROATE 0.1 % EX OINT: TOPICAL_OINTMENT | Freq: Every day | CUTANEOUS | 1 refills | Status: DC

## 2017-07-27 NOTE — Telephone Encounter (Signed)
MD sent ointment to [facility]Marland Kitchen.Raechel Chute/lmb

## 2018-06-18 DIAGNOSIS — H40033 Anatomical narrow angle, bilateral: Secondary | ICD-10-CM | POA: Diagnosis not present

## 2018-06-18 DIAGNOSIS — H04123 Dry eye syndrome of bilateral lacrimal glands: Secondary | ICD-10-CM | POA: Diagnosis not present

## 2018-09-11 ENCOUNTER — Ambulatory Visit: Payer: 59 | Admitting: Internal Medicine

## 2018-09-11 ENCOUNTER — Encounter: Payer: Self-pay | Admitting: Internal Medicine

## 2018-09-11 ENCOUNTER — Ambulatory Visit (INDEPENDENT_AMBULATORY_CARE_PROVIDER_SITE_OTHER)
Admission: RE | Admit: 2018-09-11 | Discharge: 2018-09-11 | Disposition: A | Discharge status: Home / Self Care | Payer: 59 | Source: Ambulatory Visit | Attending: Internal Medicine | Admitting: Internal Medicine

## 2018-09-11 ENCOUNTER — Other Ambulatory Visit (INDEPENDENT_AMBULATORY_CARE_PROVIDER_SITE_OTHER): Payer: 59

## 2018-09-11 VITALS — BP 140/80 | HR 88 | Temp 98.6°F | Ht 70.0 in | Wt 169.0 lb

## 2018-09-11 DIAGNOSIS — R1032 Left lower quadrant pain: Secondary | ICD-10-CM

## 2018-09-11 DIAGNOSIS — R1012 Left upper quadrant pain: Secondary | ICD-10-CM | POA: Diagnosis not present

## 2018-09-11 LAB — CBC
HCT: 45.2 % (ref 39.0–52.0)
Hemoglobin: 14.8 g/dL (ref 13.0–17.0)
MCHC: 32.7 g/dL (ref 30.0–36.0)
MCV: 85.4 fl (ref 78.0–100.0)
Platelets: 267 10*3/uL (ref 150.0–400.0)
RBC: 5.29 Mil/uL (ref 4.22–5.81)
RDW: 13.5 % (ref 11.5–15.5)
WBC: 6.5 10*3/uL (ref 4.0–10.5)

## 2018-09-11 LAB — COMPREHENSIVE METABOLIC PANEL
ALT: 11 U/L (ref 0–53)
AST: 20 U/L (ref 0–37)
Albumin: 4.8 g/dL (ref 3.5–5.2)
Alkaline Phosphatase: 52 U/L (ref 39–117)
BUN: 13 mg/dL (ref 6–23)
CO2: 30 mEq/L (ref 19–32)
Calcium: 9.9 mg/dL (ref 8.4–10.5)
Chloride: 104 mEq/L (ref 96–112)
Creatinine, Ser: 1.15 mg/dL (ref 0.40–1.50)
GFR: 84.4 mL/min (ref >60.00)
Glucose, Bld: 75 mg/dL (ref 70–99)
Potassium: 3.9 mEq/L (ref 3.5–5.1)
SODIUM: 141 meq/L (ref 135–145)
Total Bilirubin: 0.9 mg/dL (ref 0.2–1.2)
Total Protein: 8 g/dL (ref 6.0–8.3)

## 2018-09-11 LAB — LIPASE: Lipase: 26 U/L (ref 11.0–59.0)

## 2018-09-11 NOTE — Progress Notes (Signed)
   Subjective:   Patient ID: Ryan Horton, male    DOB: 02/22/1976, 43 y.o.   MRN: [id number]  HPI The patient is a 43 YO man coming in for left sided abdominal pain. Started last night at 6/10 pain. Denies any recent activity prior to onset. No new lifting or bending or injury or overuse. Denies change in diet recently. Denies diarrhea or constipation. Denies blood in stool. Denies urinary problems or discharge. Denies nausea or vomiting. Ate lunch today and this did not change the pain. Denies worsening with deep breathing. Not worse with bending or twisting. Has not tried anything for the pain since onset. Overall mildly better today but stable. No recent long travel and no history or family history of blood clots. Pain does not radiate into the back or anywhere else.   PMH, [facility], social history reviewed and updated.   Review of Systems  Constitutional: Negative.   HENT: Negative.   Eyes: Negative.   Respiratory: Negative for cough, chest tightness and shortness of breath.   Cardiovascular: Negative for chest pain, palpitations and leg swelling.  Gastrointestinal: Positive for abdominal pain. Negative for abdominal distention, anal bleeding, blood in stool, constipation, diarrhea, nausea, rectal pain and vomiting.  Musculoskeletal: Negative.   Skin: Negative.   Neurological: Negative.   Psychiatric/Behavioral: Negative.     Objective:  Physical Exam Constitutional:      Appearance: He is well-developed.  HENT:     Head: Normocephalic and atraumatic.  Neck:     Musculoskeletal: Normal range of motion.  Cardiovascular:     Rate and Rhythm: Normal rate and regular rhythm.  Pulmonary:     Effort: Pulmonary effort is normal. No respiratory distress.     Breath sounds: Normal breath sounds. No wheezing or rales.  Abdominal:     General: Bowel sounds are normal. There is no distension.     Palpations: Abdomen is soft.     Tenderness: There is abdominal tenderness in the left lower  quadrant. There is no left CVA tenderness, guarding or rebound. Negative signs include Murphy's sign and McBurney's sign.  Skin:    General: Skin is warm and dry.  Neurological:     Mental Status: He is alert and oriented to person, place, and time.     Coordination: Coordination normal.     Vitals:   09/11/18 1447  BP: 140/80  Pulse: 88  Temp: 98.6 F (37 C)  TempSrc: Oral  SpO2: 96%  Weight: 169 lb (76.7 kg)  Height: 5\' 10"  (1.778 m)    Assessment & Plan:

## 2018-09-11 NOTE — Assessment & Plan Note (Signed)
Checking CBC, CMP, lipase and x-ray abdomen. He has prior CT scan with heavy stool burden. Could be diverticulitis and if etiology not determined with labs can consider CT abdomen with contrast to rule out diverticulitis.

## 2018-09-11 NOTE — Patient Instructions (Signed)
We are checking labs and x-ray today to find out if we can narrow down the problem with the stomach.   Try tylenol for pain to see if this helps.

## 2018-09-12 ENCOUNTER — Other Ambulatory Visit: Payer: Self-pay | Admitting: Internal Medicine

## 2018-09-12 ENCOUNTER — Telehealth: Payer: Self-pay | Admitting: Internal Medicine

## 2018-09-12 DIAGNOSIS — R1030 Lower abdominal pain, unspecified: Secondary | ICD-10-CM

## 2018-09-12 NOTE — Telephone Encounter (Signed)
Pt given results per Dr Hillard Danker, "No signs of bowel obstruction on the x-ray. The labs are also normal. Are you still having the pain, better worse the same? If not better or worse we can order CT scan of the stomach to look for infection in the colon.";the pt states that he is still having pain to the touch but better; he also did have a large, loose BM after having 8 oz of prune juice; the pt states he would like to have the CT done to rule out anything; he can be contacted at [phone] and a message can be left; will route to office for notification; also see result note dated 09/12/2018.

## 2018-09-12 NOTE — Telephone Encounter (Signed)
Copied from CRM [id number]. Topic: Quick Communication - Other Results (Clinic Use ONLY) >> Sep 12, 2018  9:35 AM Josetta Huddle, CMA wrote: Called patient to inform them of recent lab and x-ray results. When patient returns call, triage nurse may disclose results.  Patient called back for results lab and Xray can be reached at Ph# [phone] (kd)

## 2018-09-26 ENCOUNTER — Encounter: Payer: Self-pay | Admitting: Internal Medicine

## 2019-03-18 ENCOUNTER — Ambulatory Visit (INDEPENDENT_AMBULATORY_CARE_PROVIDER_SITE_OTHER): Payer: 59

## 2019-03-18 ENCOUNTER — Other Ambulatory Visit: Payer: Self-pay

## 2019-03-18 DIAGNOSIS — Z23 Encounter for immunization: Secondary | ICD-10-CM | POA: Diagnosis not present

## 2019-06-21 ENCOUNTER — Ambulatory Visit (INDEPENDENT_AMBULATORY_CARE_PROVIDER_SITE_OTHER): Payer: 59 | Admitting: Internal Medicine

## 2019-06-21 ENCOUNTER — Other Ambulatory Visit: Payer: Self-pay

## 2019-06-21 ENCOUNTER — Encounter: Payer: Self-pay | Admitting: Internal Medicine

## 2019-06-21 VITALS — BP 118/78 | HR 77 | Temp 98.4°F | Ht 70.0 in | Wt 172.0 lb

## 2019-06-21 DIAGNOSIS — D869 Sarcoidosis, unspecified: Secondary | ICD-10-CM | POA: Diagnosis not present

## 2019-06-21 DIAGNOSIS — H0014 Chalazion left upper eyelid: Secondary | ICD-10-CM | POA: Diagnosis not present

## 2019-06-21 DIAGNOSIS — I10 Essential (primary) hypertension: Secondary | ICD-10-CM

## 2019-06-21 MED ORDER — AMOXICILLIN-POT CLAVULANATE 875-125 MG PO TABS: 1.0000 | ORAL_TABLET | Freq: Two times a day (BID) | ORAL | 0 refills | Status: DC

## 2019-06-21 NOTE — Patient Instructions (Signed)
Today you were seen for a left upper eyelid Chalazion, that may also have early infection involved  Please take all new medication as prescribed - the antibiotic  You will be contacted regarding the referral for: ophthalmology (eye doctor)  Please continue all other medications as before, and refills have been done if requested.  Please have the pharmacy call with any other refills you may need.  Please keep your appointments with your specialists as you may have planned

## 2019-06-22 ENCOUNTER — Encounter: Payer: Self-pay | Admitting: Internal Medicine

## 2019-06-22 NOTE — Assessment & Plan Note (Signed)
D/w pt, not likely to be related, ok to continue same tx

## 2019-06-22 NOTE — Progress Notes (Signed)
Subjective:    Patient ID: Ryan Horton, male    DOB: 09-Dec-1975, 43 y.o.   MRN: [id number]  HPI  Here with 3-4 day gradually worsening left upper eyelid pain sharp mild constant, started with stye about mid eyelid but seemed to move on to a localized red, tedner, swelling to the mid left upper eyelid, with matting but no other drainage or d/c.  No fever, chills, trauma, vision  Change, ST, HA, cough and Pt denies chest pain, increased sob or doe, wheezing, orthopnea, PND, increased LE swelling, palpitations, dizziness or syncope.  No prior hx of same No past medical history on file. Past Surgical History:  Procedure Laterality Date  . KNEE ARTHROSCOPY  2011   right  . VASECTOMY  06/12/2009    reports that he has never smoked. He has never used smokeless tobacco. He reports current alcohol use of about 2.0 standard drinks of alcohol per week. He reports that he does not use drugs. family history includes Arthritis in his father. Allergies  Allergen Reactions  . Other     EKG electrodes-rash   Current Outpatient Medications on File Prior to Visit  Medication Sig Dispense Refill  . mometasone (ELOCON) 0.1 % ointment Apply topically daily. 45 g 1   No current facility-administered medications on file prior to visit.   Review of Systems  Constitutional: Negative for other unusual diaphoresis or sweats HENT: Negative for ear discharge or swelling Eyes: Negative for other worsening visual disturbances Respiratory: Negative for stridor or other swelling  Gastrointestinal: Negative for worsening distension or other blood Genitourinary: Negative for retention or other urinary change Musculoskeletal: Negative for other MSK pain or swelling Skin: Negative for color change or other new lesions Neurological: Negative for worsening tremors and other numbness  Psychiatric/Behavioral: Negative for worsening agitation or other fatigue All otherwise neg per pt    Objective:   Physical  Exam BP 118/78   Pulse 77   Temp 98.4 F (36.9 C) (Oral)   Ht 5\' 10"  (1.778 m)   Wt 172 lb (78 kg)   SpO2 98%   BMI 24.68 kg/m  VS noted,  Constitutional: Pt appears in NAD HENT: Head: NCAT.  Right Ear: External ear normal.  Left Ear: External ear normal.  Eyes: . Pupils are equal, round, and reactive to light. Conjunctivae and EOM are normal, left upper mid eyelid with < 1/2 cm red, tender ? Fluctuance swelling without overlying other skin change or drainage or even evidence for recent stye formation Nose: without d/c or deformity Neck: Neck supple. Gross normal ROM Cardiovascular: Normal rate and regular rhythm.   Pulmonary/Chest: Effort normal and breath sounds without rales or wheezing.  Neurological: Pt is alert. At baseline orientation, motor grossly intact Skin: Skin is warm. No rashes, other new lesions, no LE edema Psychiatric: Pt behavior is normal without agitation  All otherwise neg per pt Lab Results  Component Value Date   WBC 6.5 09/11/2018   HGB 14.8 09/11/2018   HCT 45.2 09/11/2018   PLT 267.0 09/11/2018   GLUCOSE 75 09/11/2018   CHOL 224 (H) 05/11/2017   TRIG 49.0 05/11/2017   HDL 63.20 05/11/2017   LDLDIRECT 114.5 10/27/2010   LDLCALC 151 (H) 05/11/2017   ALT 11 09/11/2018   AST 20 09/11/2018   NA 141 09/11/2018   K 3.9 09/11/2018   CL 104 09/11/2018   CREATININE 1.15 09/11/2018   BUN 13 09/11/2018   CO2 30 09/11/2018   TSH 1.59 05/11/2017  PSA 1.10 05/11/2017         Assessment & Plan:

## 2019-06-22 NOTE — Assessment & Plan Note (Signed)
stable overall by history and exam, recent data reviewed with pt, and pt to continue medical treatment as before,  to f/u any worsening symptoms or concerns  

## 2019-06-22 NOTE — Assessment & Plan Note (Signed)
With ? Infection, for augmentin asd, refer ophthalmology,  to f/u any worsening symptoms or concerns

## 2019-08-26 ENCOUNTER — Telehealth: Payer: Self-pay | Admitting: Internal Medicine

## 2019-08-26 NOTE — Telephone Encounter (Signed)
LVM for pt to call back as soon as possible.   

## 2019-08-26 NOTE — Telephone Encounter (Signed)
    Patient has questions about covid vaccine versus sarcoidosis  Please call [phone]

## 2019-08-26 NOTE — Telephone Encounter (Signed)
Spoke to pt and he stated he was concerned due to the risk section of the Vaccine Fact Sheet that stated there could be a side effect of lymphadenopathy.   Pt stated that due to his sarcoidosis, we was concerned how that would affect.   Per PCP, okay to get the COVID vaccine. Pt to call us and let us know if he experiences any issues after receiving the first or second dose.

## 2019-08-28 ENCOUNTER — Encounter (HOSPITAL_COMMUNITY): Payer: Self-pay

## 2019-08-28 ENCOUNTER — Other Ambulatory Visit: Payer: Self-pay

## 2019-08-28 ENCOUNTER — Ambulatory Visit (HOSPITAL_COMMUNITY)
Admission: EM | Admit: 2019-08-28 | Discharge: 2019-08-28 | Disposition: A | Discharge status: Home / Self Care | Payer: 59 | Attending: Family Medicine | Admitting: Family Medicine

## 2019-08-28 DIAGNOSIS — H9202 Otalgia, left ear: Secondary | ICD-10-CM

## 2019-08-28 MED ORDER — AMOXICILLIN-POT CLAVULANATE 875-125 MG PO TABS: 1.0000 | ORAL_TABLET | Freq: Two times a day (BID) | ORAL | 0 refills | Status: AC

## 2019-08-28 MED ORDER — PREDNISONE 5 MG PO TABS: ORAL_TABLET | ORAL | 0 refills | Status: DC

## 2019-08-28 NOTE — ED Triage Notes (Signed)
Pt state he has pain in his left ear this started today at around 4 pm the pain id radiating down into his neck.

## 2019-08-28 NOTE — Discharge Instructions (Signed)
Please try the prednisone first. If the symptoms fail to improve then try the antibiotics  You can take tylenol as well  Please follow up if your symptoms fail to improve.

## 2019-08-28 NOTE — ED Provider Notes (Signed)
[facility]    CSN: [id number] Arrival date & time: 08/28/19  1754      History   Chief Complaint Chief Complaint  Patient presents with  . Otalgia    HPI Ryan Horton is a 44 y.o. male.   He is presenting with left ear pain.  This is got acutely worse today.  He is having some pain in the posterior auricular area.  Denies any fever or chills.  He did clean out his ears earlier this morning with peroxide.  No history of surgery.  Pain can be severe at times.  HPI  History reviewed. No pertinent past medical history.  Patient Active Problem List   Diagnosis Date Noted  . Chalazion of left upper eyelid 06/21/2019  . Left lower quadrant abdominal pain 09/11/2018  . Eczema 06/22/2017  . Essential hypertension 05/11/2017  . OSA (obstructive sleep apnea) 04/23/2014  . Tinea versicolor 01/31/2013  . Routine general medical examination at a health care facility 10/27/2010  . Sarcoidosis 10/07/2010  . TACHYCARDIA, PAROXYSMAL SUPRAVENTRICULAR 08/31/2006  . OSGOOD SCHLATTERS DISEASE 08/31/2006    Past Surgical History:  Procedure Laterality Date  . KNEE ARTHROSCOPY  2011   right  . VASECTOMY  06/12/2009       Home Medications    Prior to Admission medications   Medication Sig Start Date End Date Taking? Authorizing Provider  amoxicillin-clavulanate (AUGMENTIN) 875-125 MG tablet Take 1 tablet by mouth 2 (two) times daily for 10 days. 08/28/19 09/07/19  Myra Rude, MD  mometasone (ELOCON) 0.1 % ointment Apply topically daily. 07/26/17   Etta Grandchild, MD  predniSONE (DELTASONE) 5 MG tablet Take 6 pills for first day, 5 pills second day, 4 pills third day, 3 pills fourth day, 2 pills the fifth day, and 1 pill sixth day. 08/28/19   Myra Rude, MD    Family History Family History  Problem Relation Age of Onset  . Arthritis Father     Social History Social History   Tobacco Use  . Smoking status: Never Smoker  . Smokeless tobacco: Never  Used  Substance Use Topics  . Alcohol use: Yes    Alcohol/week: 2.0 standard drinks    Types: 2 Cans of beer per week  . Drug use: No     Allergies   Other   Review of Systems Review of Systems See HPI Physical Exam Triage Vital Signs ED Triage Vitals  Enc Vitals Group     BP 08/28/19 1840 (!) 144/98     Pulse Rate 08/28/19 1840 63     Resp --      Temp 08/28/19 1840 98.5 F (36.9 C)     Temp Source 08/28/19 1840 Oral     SpO2 08/28/19 1840 98 %     Weight 08/28/19 1840 160 lb (72.6 kg)     Height --      Head Circumference --      Peak Flow --      Pain Score 08/28/19 1839 8     Pain Loc --      Pain Edu? --      Excl. in GC? --    No data found.  Updated Vital Signs BP (!) 144/98 (BP Location: Right Arm)   Pulse 63   Temp 98.5 F (36.9 C) (Oral)   Wt 72.6 kg   SpO2 98%   BMI 22.96 kg/m   Visual Acuity Right Eye Distance:   Left Eye Distance:  Bilateral Distance:    Right Eye Near:   Left Eye Near:    Bilateral Near:     Physical Exam Gen: NAD, alert, cooperative with exam, well-appearing ENT: normal lips, normal nasal mucosa, left tympanic membrane appears white with possible effusion, light reflex is intact, no erythema, no tenderness to the tragus, no tenderness of the auditory canal, normal oropharynx, no cervical lymphadenopathy Eye: normal EOM, normal conjunctiva and lids Skin: no rashes, no areas of induration      UC Treatments / Results  Labs (all labs ordered are listed, but only abnormal results are displayed) Labs Reviewed - No data to display  EKG   Radiology No results found.  Procedures Procedures (including critical care time)  Medications Ordered in UC Medications - No data to display  Initial Impression / Assessment and Plan / UC Course  I have reviewed the triage vital signs and the nursing notes.  Pertinent labs & imaging results that were available during my care of the patient were reviewed by me and  considered in my medical decision making (see chart for details).     [name] is a 44 year old male that is presenting with otalgia of the left ear.  Does not appear to be a full infection at this point.  May have the beginning of that.  May have some eustachian tube dysfunction occurring.  Less likely for otitis externa.  Provided prednisone and Augmentin.  Counseled on their use.  Counseled on supportive care.  Given indications to follow-up and return.  Final Clinical Impressions(s) / UC Diagnoses   Final diagnoses:  Left ear pain     Discharge Instructions     Please try the prednisone first. If the symptoms fail to improve then try the antibiotics  You can take tylenol as well  Please follow up if your symptoms fail to improve.     ED Prescriptions    Medication Sig Dispense Auth. Provider   predniSONE (DELTASONE) 5 MG tablet Take 6 pills for first day, 5 pills second day, 4 pills third day, 3 pills fourth day, 2 pills the fifth day, and 1 pill sixth day. 21 tablet Rosemarie Ax, MD   amoxicillin-clavulanate (AUGMENTIN) 875-125 MG tablet Take 1 tablet by mouth 2 (two) times daily for 10 days. 20 tablet Rosemarie Ax, MD     PDMP not reviewed this encounter.   Rosemarie Ax, MD 08/28/19 [phone]

## 2019-08-29 ENCOUNTER — Telehealth: Payer: Self-pay | Admitting: Internal Medicine

## 2019-08-29 ENCOUNTER — Ambulatory Visit: Payer: 59 | Admitting: Internal Medicine

## 2019-08-29 NOTE — Telephone Encounter (Signed)
New message:  Pt is calling and states he ended up at the ER last night due to some severe ear pain. Pt states they gave him some prednisone (5day supply) and told him to start taking Amoxicillin if the symptoms continue. Pt was due to have an appt this morning with Dr. Okey Dupre but patient just called to inform us of going to the ER last night.

## 2019-08-29 NOTE — Telephone Encounter (Signed)
Noted and appt canceled

## 2019-09-30 ENCOUNTER — Encounter: Payer: Self-pay | Admitting: Internal Medicine

## 2019-09-30 ENCOUNTER — Other Ambulatory Visit: Payer: Self-pay

## 2019-09-30 ENCOUNTER — Ambulatory Visit (INDEPENDENT_AMBULATORY_CARE_PROVIDER_SITE_OTHER): Payer: 59 | Admitting: Internal Medicine

## 2019-09-30 VITALS — BP 122/88 | HR 72 | Temp 98.2°F | Resp 16 | Ht 70.0 in | Wt 171.0 lb

## 2019-09-30 DIAGNOSIS — Z0000 Encounter for general adult medical examination without abnormal findings: Secondary | ICD-10-CM

## 2019-09-30 DIAGNOSIS — E559 Vitamin D deficiency, unspecified: Secondary | ICD-10-CM

## 2019-09-30 DIAGNOSIS — I10 Essential (primary) hypertension: Secondary | ICD-10-CM | POA: Diagnosis not present

## 2019-09-30 DIAGNOSIS — D869 Sarcoidosis, unspecified: Secondary | ICD-10-CM | POA: Diagnosis not present

## 2019-09-30 DIAGNOSIS — R9431 Abnormal electrocardiogram [ECG] [EKG]: Secondary | ICD-10-CM | POA: Diagnosis not present

## 2019-09-30 LAB — LIPID PANEL
Cholesterol: 190 mg/dL (ref 0–200)
HDL: 55.9 mg/dL (ref >39.00)
LDL Cholesterol: 126 mg/dL — ABNORMAL HIGH (ref 0–99)
NonHDL: 134.58
Total CHOL/HDL Ratio: 3
Triglycerides: 43 mg/dL (ref 0.0–149.0)
VLDL: 8.6 mg/dL (ref 0.0–40.0)

## 2019-09-30 LAB — BASIC METABOLIC PANEL
BUN: 12 mg/dL (ref 6–23)
CO2: 30 mEq/L (ref 19–32)
Calcium: 9.9 mg/dL (ref 8.4–10.5)
Chloride: 104 mEq/L (ref 96–112)
Creatinine, Ser: 1.22 mg/dL (ref 0.40–1.50)
GFR: 78.44 mL/min (ref >60.00)
Glucose, Bld: 101 mg/dL — ABNORMAL HIGH (ref 70–99)
Potassium: 4.1 mEq/L (ref 3.5–5.1)
Sodium: 140 mEq/L (ref 135–145)

## 2019-09-30 LAB — CBC WITH DIFFERENTIAL/PLATELET
Basophils Absolute: 0 10*3/uL (ref 0.0–0.1)
Basophils Relative: 0.9 % (ref 0.0–3.0)
Eosinophils Absolute: 0.1 10*3/uL (ref 0.0–0.7)
Eosinophils Relative: 1.9 % (ref 0.0–5.0)
HCT: 44.7 % (ref 39.0–52.0)
Hemoglobin: 14.6 g/dL (ref 13.0–17.0)
Lymphocytes Relative: 24.8 % (ref 12.0–46.0)
Lymphs Abs: 1.4 10*3/uL (ref 0.7–4.0)
MCHC: 32.7 g/dL (ref 30.0–36.0)
MCV: 86.3 fl (ref 78.0–100.0)
Monocytes Absolute: 0.6 10*3/uL (ref 0.1–1.0)
Monocytes Relative: 10.8 % (ref 3.0–12.0)
Neutro Abs: 3.4 10*3/uL (ref 1.4–7.7)
Neutrophils Relative %: 61.6 % (ref 43.0–77.0)
Platelets: 251 10*3/uL (ref 150.0–400.0)
RBC: 5.17 Mil/uL (ref 4.22–5.81)
RDW: 13.4 % (ref 11.5–15.5)
WBC: 5.5 10*3/uL (ref 4.0–10.5)

## 2019-09-30 LAB — HEPATIC FUNCTION PANEL
ALT: 11 U/L (ref 0–53)
AST: 19 U/L (ref 0–37)
Albumin: 4.6 g/dL (ref 3.5–5.2)
Alkaline Phosphatase: 54 U/L (ref 39–117)
Bilirubin, Direct: 0.2 mg/dL (ref 0.0–0.3)
Total Bilirubin: 0.9 mg/dL (ref 0.2–1.2)
Total Protein: 7.3 g/dL (ref 6.0–8.3)

## 2019-09-30 LAB — PSA: PSA: 1.14 ng/mL (ref 0.10–4.00)

## 2019-09-30 LAB — URINALYSIS, ROUTINE W REFLEX MICROSCOPIC
Bilirubin Urine: NEGATIVE
Hgb urine dipstick: NEGATIVE
Ketones, ur: NEGATIVE
Leukocytes,Ua: NEGATIVE
Nitrite: NEGATIVE
RBC / HPF: NONE SEEN (ref 0–?)
Specific Gravity, Urine: 1.02 (ref 1.000–1.030)
Total Protein, Urine: NEGATIVE
Urine Glucose: NEGATIVE
Urobilinogen, UA: 0.2 (ref 0.0–1.0)
pH: 6 (ref 5.0–8.0)

## 2019-09-30 LAB — TSH: TSH: 1.43 u[IU]/mL (ref 0.35–4.50)

## 2019-09-30 LAB — VITAMIN D 25 HYDROXY (VIT D DEFICIENCY, FRACTURES): VITD: 21.07 ng/mL — ABNORMAL LOW (ref 30.00–100.00)

## 2019-09-30 MED ORDER — CHOLECALCIFEROL 50 MCG (2000 UT) PO TABS: 1.0000 | ORAL_TABLET | Freq: Every day | ORAL | 1 refills | Status: DC

## 2019-09-30 NOTE — Patient Instructions (Signed)

## 2019-09-30 NOTE — Progress Notes (Signed)
Subjective:  Patient ID: Ryan Horton, male    DOB: 16-Dec-1975  Age: 44 y.o. MRN: [id number]  CC: Annual Exam and Hypertension  This visit occurred during the SARS-CoV-2 public health emergency.  Safety protocols were in place, including screening questions prior to the visit, additional usage of staff PPE, and extensive cleaning of exam room while observing appropriate contact time as indicated for disinfecting solutions.    HPI Ryan Horton presents for a CPX.  He has a remote history of sarcoid and tachycardia.  He denies any recent episodes of palpitations.  He is active and denies any recent episodes of CP, DOE, edema, or fatigue.   Outpatient Medications Prior to Visit  Medication Sig Dispense Refill  . mometasone (ELOCON) 0.1 % ointment Apply topically daily. 45 g 1  . predniSONE (DELTASONE) 5 MG tablet Take 6 pills for first day, 5 pills second day, 4 pills third day, 3 pills fourth day, 2 pills the fifth day, and 1 pill sixth day. 21 tablet 0   No facility-administered medications prior to visit.    ROS Review of Systems  Constitutional: Negative for appetite change, diaphoresis, fatigue and unexpected weight change.  HENT: Negative.   Eyes: Negative for visual disturbance.  Respiratory: Negative for cough, chest tightness, shortness of breath and wheezing.   Cardiovascular: Negative for chest pain, palpitations and leg swelling.  Gastrointestinal: Negative for abdominal pain, constipation, diarrhea, nausea and vomiting.  Endocrine: Negative.   Genitourinary: Negative.  Negative for difficulty urinating, hematuria, scrotal swelling, testicular pain and urgency.  Musculoskeletal: Negative.  Negative for arthralgias, back pain, myalgias and neck pain.  Skin: Negative.  Negative for color change.  Neurological: Negative for dizziness, weakness and light-headedness.  Hematological: Negative for adenopathy. Does not bruise/bleed easily.  Psychiatric/Behavioral:  Negative.     Objective:  BP 122/88 (BP Location: Left Arm, Patient Position: Sitting, Cuff Size: Normal)   Pulse 72   Temp 98.2 F (36.8 C) (Oral)   Resp 16   Ht 5\' 10"  (1.778 m)   Wt 171 lb (77.6 kg)   SpO2 97%   BMI 24.54 kg/m   BP Readings from Last 3 Encounters:  09/30/19 122/88  08/28/19 (!) 144/98  06/21/19 118/78    Wt Readings from Last 3 Encounters:  09/30/19 171 lb (77.6 kg)  08/28/19 160 lb (72.6 kg)  06/21/19 172 lb (78 kg)    Physical Exam Vitals reviewed.  Constitutional:      Appearance: Normal appearance.  HENT:     Nose: Nose normal.     Mouth/Throat:     Mouth: Mucous membranes are moist.  Eyes:     General: No scleral icterus.    Conjunctiva/sclera: Conjunctivae normal.  Cardiovascular:     Rate and Rhythm: Normal rate and regular rhythm.     Heart sounds: Normal heart sounds, S1 normal and S2 normal. No murmur.     Comments: EKG -  NSR +early repol No change compared to the prior EKG Pulmonary:     Effort: Pulmonary effort is normal.     Breath sounds: No stridor. No wheezing, rhonchi or rales.  Abdominal:     General: Abdomen is flat. Bowel sounds are normal. There is no distension.     Palpations: Abdomen is soft. There is no hepatomegaly, splenomegaly or mass.     Tenderness: There is no abdominal tenderness.     Hernia: There is no hernia in the left inguinal area or right inguinal  area.  Genitourinary:    Pubic Area: No rash.      Penis: Normal. No discharge, swelling or lesions.      Testes: Normal.        Right: Mass, tenderness or swelling not present.        Left: Mass, tenderness or swelling not present.     Epididymis:     Right: Normal. Not inflamed or enlarged. No mass or tenderness.     Left: Normal. Not inflamed or enlarged. No mass or tenderness.     Prostate: Normal. Not enlarged and no nodules present.     Rectum: Normal. Guaiac result negative. No mass, tenderness, anal fissure, external hemorrhoid or internal  hemorrhoid. Normal anal tone.  Musculoskeletal:        General: Normal range of motion.     Cervical back: Neck supple.     Right lower leg: No edema.     Left lower leg: No edema.  Lymphadenopathy:     Cervical: No cervical adenopathy.     Lower Body: No right inguinal adenopathy. No left inguinal adenopathy.  Skin:    General: Skin is warm and dry.     Coloration: Skin is not pale.  Neurological:     General: No focal deficit present.     Mental Status: He is alert and oriented to person, place, and time. Mental status is at baseline.  Psychiatric:        Behavior: Behavior normal.     Lab Results  Component Value Date   WBC 5.5 09/30/2019   HGB 14.6 09/30/2019   HCT 44.7 09/30/2019   PLT 251.0 09/30/2019   GLUCOSE 101 (H) 09/30/2019   CHOL 190 09/30/2019   TRIG 43.0 09/30/2019   HDL 55.90 09/30/2019   LDLDIRECT 114.5 10/27/2010   LDLCALC 126 (H) 09/30/2019   ALT 11 09/30/2019   AST 19 09/30/2019   NA 140 09/30/2019   K 4.1 09/30/2019   CL 104 09/30/2019   CREATININE 1.22 09/30/2019   BUN 12 09/30/2019   CO2 30 09/30/2019   TSH 1.43 09/30/2019   PSA 1.14 09/30/2019    No results found.  Assessment & Plan:   Ryan Horton was seen today for annual exam and hypertension.  Diagnoses and all orders for this visit:  Essential hypertension- His blood pressure is well controlled with lifestyle modifications.  Labs are negative for secondary causes or endorgan damage.  His EKG is negative for LVH.  Medical therapy is not indicated. -     CBC with Differential/Platelet; Future -     Basic metabolic panel; Future -     TSH; Future -     Urinalysis, Routine w reflex microscopic; Future -     VITAMIN D 25 Hydroxy (Vit-D Deficiency, Fractures); Future -     Hepatic function panel; Future -     EKG 12-Lead -     Hepatic function panel -     VITAMIN D 25 Hydroxy (Vit-D Deficiency, Fractures) -     Urinalysis, Routine w reflex microscopic -     TSH -     Basic metabolic  panel -     CBC with Differential/Platelet  Routine general medical examination at a health care facility- Exam completed, labs reviewed-statin therapy is not indicated, vaccines reviewed and updated, patient education material was given. -     Lipid panel; Future -     HIV Antibody (routine testing w rflx); Future -     PSA;  Future -     PSA -     HIV Antibody (routine testing w rflx) -     Lipid panel  Sarcoidosis- His calcium level is normal.  He has a mildly abnormal EKG so I have asked him to see cardiology to see if he needs to be evaluated for cardiac involvement. -     Basic metabolic panel; Future -     VITAMIN D 25 Hydroxy (Vit-D Deficiency, Fractures); Future -     Hepatic function panel; Future -     Ambulatory referral to Cardiology -     Hepatic function panel -     VITAMIN D 25 Hydroxy (Vit-D Deficiency, Fractures) -     Basic metabolic panel  Abnormal electrocardiogram (ECG) (EKG) -     Ambulatory referral to Cardiology  Vitamin D deficiency -     Cholecalciferol 50 MCG (2000 UT) TABS; Take 1 tablet (2,000 Units total) by mouth daily.   I have discontinued Ryan Horton's mometasone and predniSONE. I am also having him start on Cholecalciferol.  Meds ordered this encounter  Medications  . Cholecalciferol 50 MCG (2000 UT) TABS    Sig: Take 1 tablet (2,000 Units total) by mouth daily.    Dispense:  90 tablet    Refill:  1     Follow-up: Return in about 1 year (around 09/29/2020).  Sanda Linger, MD

## 2019-10-01 ENCOUNTER — Encounter: Payer: Self-pay | Admitting: Internal Medicine

## 2019-10-01 LAB — HIV ANTIBODY (ROUTINE TESTING W REFLEX): HIV 1&2 Ab, 4th Generation: NONREACTIVE

## 2019-10-06 NOTE — Progress Notes (Signed)
Cardiology Office Note:    Date:  10/07/2019   ID:  Ryan Horton, DOB 06/17/1976, MRN [id number]  PCP:  Janith Lima, MD  Cardiologist:  Donato Heinz, MD  Electrophysiologist:  None   Referring MD: Janith Lima, MD   Chief Complaint  Patient presents with  . Abnormal ECG    History of Present Illness:    Ryan Horton is a 44 y.o. male with a hx of sarcoidosis who is referred by Dr. Ronnald Ramp for evaluation of abnormal EKG. He reports he was diagnosed with pulmonary sarcoidosis several years ago and started on treatment with prednisone.  Reports symptoms resolved and he was weaned off prednisone.  Was told disease is currently in remission and he could follow-up with pulmonology as needed.  Reports that he is active, exercises regularly.  Does push-ups every day, and runs on a treadmill twice per week for 2.5 to 3 miles.  Denies any exertional chest pain or dyspnea.  Denies any lightheadedness, palpitations, syncope, or lower extremity edema.   No past medical history on file.  Past Surgical History:  Procedure Laterality Date  . KNEE ARTHROSCOPY  2011   right  . VASECTOMY  06/12/2009    Current Medications: Current Meds  Medication Sig  . [DISCONTINUED] Cholecalciferol 50 MCG (2000 UT) TABS Take 1 tablet (2,000 Units total) by mouth daily.     Allergies:   Other   Social History   Socioeconomic History  . Marital status: Married    Spouse name: Not on file  . Number of children: Not on file  . Years of education: Not on file  . Highest education level: Not on file  Occupational History  . Occupation: IT sales professional at [employer]  Tobacco Use  . Smoking status: Never Smoker  . Smokeless tobacco: Never Used  Substance and Sexual Activity  . Alcohol use: Yes    Alcohol/week: 2.0 standard drinks    Types: 2 Cans of beer per week  . Drug use: No  . Sexual activity: Yes  Other Topics Concern  . Not on file  Social History Narrative   Used  to work with [employer]   Social Determinants of Health   Financial Resource Strain:   . Difficulty of Paying Living Expenses:   Food Insecurity:   . Worried About Charity fundraiser in the Last Year:   . Arboriculturist in the Last Year:   Transportation Needs:   . Film/video editor (Medical):   Ryan Horton Kitchen Lack of Transportation (Non-Medical):   Physical Activity:   . Days of Exercise per Week:   . Minutes of Exercise per Session:   Stress:   . Feeling of Stress :   Social Connections:   . Frequency of Communication with Friends and Family:   . Frequency of Social Gatherings with Friends and Family:   . Attends Religious Services:   . Active Member of Clubs or Organizations:   . Attends Archivist Meetings:   Ryan Horton Kitchen Marital Status:      Family History: The patient's family history includes Arthritis in his father.  ROS:   Please see the history of present illness.     All other systems reviewed and are negative.  EKGs/Labs/Other Studies Reviewed:    The following studies were reviewed today:   EKG:  EKG is  ordered today.  The ekg ordered today demonstrates normal sinus rhythm, rate 81, early repolarization  Recent  Labs: 09/30/2019: ALT 11; BUN 12; Creatinine, Ser 1.22; Hemoglobin 14.6; Platelets 251.0; Potassium 4.1; Sodium 140; TSH 1.43  Recent Lipid Panel    Component Value Date/Time   CHOL 190 09/30/2019 0925   TRIG 43.0 09/30/2019 0925   HDL 55.90 09/30/2019 0925   CHOLHDL 3 09/30/2019 0925   VLDL 8.6 09/30/2019 0925   LDLCALC 126 (H) 09/30/2019 0925   LDLDIRECT 114.5 10/27/2010 1703    Physical Exam:    VS:  BP 110/80   Pulse 81   Temp 97.7 F (36.5 C)   Resp (!) 96   Ht 5\' 10"  (1.778 m)   Wt 173 lb 3.2 oz (78.6 kg)   BMI 24.85 kg/m     Wt Readings from Last 3 Encounters:  10/07/19 173 lb 3.2 oz (78.6 kg)  09/30/19 171 lb (77.6 kg)  08/28/19 160 lb (72.6 kg)     GEN:  Well nourished, well developed in no acute distress HEENT:  Normal NECK: No JVD; No carotid bruits LYMPHATICS: No lymphadenopathy CARDIAC: RRR, no murmurs, rubs, gallops RESPIRATORY:  Clear to auscultation without rales, wheezing or rhonchi  ABDOMEN: Soft, non-tender, non-distended MUSCULOSKELETAL:  No edema; No deformity  SKIN: Warm and dry NEUROLOGIC:  Alert and oriented x 3 PSYCHIATRIC:  Normal affect   ASSESSMENT:    1. Sarcoidosis    PLAN:    Sarcoidosis: Last seen by pulmonology in 2015.  Has been off prednisone since 2014.  Will check cardiac MRI to evaluate for cardiac involvement  RTC in 3 months   Medication Adjustments/Labs and Tests Ordered: Current medicines are reviewed at length with the patient today.  Concerns regarding medicines are outlined above.  Orders Placed This Encounter  Procedures  . MR CARDIAC MORPHOLOGY W WO CONTRAST  . EKG 12-Lead   No orders of the defined types were placed in this encounter.   Patient Instructions  Medication Instructions:  Your physician recommends that you continue on your current medications as directed. Please refer to the Current Medication list given to you today.  Lab Work: NONE  Testing/Procedures: Your physician has requested that you have a cardiac MRI. Cardiac MRI uses a computer to create images of your heart as its beating, producing both still and moving pictures of your heart and major blood vessels. For further information please visit 2015. Please follow the instruction sheet given to you today for more information. --this must be authorized by insurance prior to scheduling.    Follow-Up: At [facility], you and your health needs are our priority.  As part of our continuing mission to provide you with exceptional heart care, we have created designated Provider Care Teams.  These Care Teams include your primary Cardiologist (physician) and Advanced Practice Providers (APPs -  Physician Assistants and Nurse Practitioners) who all work together to  provide you with the care you need, when you need it.  We recommend signing up for the patient portal called "MyChart".  Sign up information is provided on this After Visit Summary.  MyChart is used to connect with patients for Virtual Visits (Telemedicine).  Patients are able to view lab/test results, encounter notes, upcoming appointments, etc.  Non-urgent messages can be sent to your provider as well.   To learn more about what you can do with MyChart, go to [facility].    Your next appointment:   3 month(s)  The format for your next appointment:   In Person  Provider:   [email or web address], MD  Signed, Little Ishikawa, MD  10/07/2019 1:01 PM     [facility]

## 2019-10-07 ENCOUNTER — Ambulatory Visit: Payer: 59 | Admitting: Cardiology

## 2019-10-07 ENCOUNTER — Other Ambulatory Visit: Payer: Self-pay

## 2019-10-07 ENCOUNTER — Encounter: Payer: Self-pay | Admitting: Cardiology

## 2019-10-07 VITALS — BP 110/80 | HR 81 | Temp 97.7°F | Resp 96 | Ht 70.0 in | Wt 173.2 lb

## 2019-10-07 DIAGNOSIS — D869 Sarcoidosis, unspecified: Secondary | ICD-10-CM

## 2019-10-07 NOTE — Patient Instructions (Signed)
Medication Instructions:  Your physician recommends that you continue on your current medications as directed. Please refer to the Current Medication list given to you today.  Lab Work: NONE  Testing/Procedures: Your physician has requested that you have a cardiac MRI. Cardiac MRI uses a computer to create images of your heart as its beating, producing both still and moving pictures of your heart and major blood vessels. For further information please visit [email or web address]. Please follow the instruction sheet given to you today for more information. --this must be authorized by insurance prior to scheduling.    Follow-Up: At [facility], you and your health needs are our priority.  As part of our continuing mission to provide you with exceptional heart care, we have created designated Provider Care Teams.  These Care Teams include your primary Cardiologist (physician) and Advanced Practice Providers (APPs -  Physician Assistants and Nurse Practitioners) who all work together to provide you with the care you need, when you need it.  We recommend signing up for the patient portal called "MyChart".  Sign up information is provided on this After Visit Summary.  MyChart is used to connect with patients for Virtual Visits (Telemedicine).  Patients are able to view lab/test results, encounter notes, upcoming appointments, etc.  Non-urgent messages can be sent to your provider as well.   To learn more about what you can do with MyChart, go to [email or web address].    Your next appointment:   3 month(s)  The format for your next appointment:   In Person  Provider:   Epifanio Lesches, MD

## 2019-10-16 ENCOUNTER — Telehealth: Payer: Self-pay | Admitting: Cardiology

## 2019-10-16 NOTE — Telephone Encounter (Signed)
Left message for patient regarding appointment for Cardiac MRI scheduled Wednesday 11/20/19 at 9:00 am at [facility]----arrival time is 8:15 am 1st floor radiology.  Will mail information to patient

## 2019-11-18 ENCOUNTER — Other Ambulatory Visit (HOSPITAL_COMMUNITY): Payer: 59

## 2019-11-19 ENCOUNTER — Telehealth (HOSPITAL_COMMUNITY): Payer: Self-pay | Admitting: *Deleted

## 2019-11-19 NOTE — Telephone Encounter (Signed)
Reaching to go over instructions concerning pt's Cardiac MRI scheduled for 9am on 5/19.  Pt was unaware of the appointment and needed to check his schedule.  Awaiting pt to return call.  Burley Saver, Cardiac Imaging Nurse Navigator

## 2019-11-19 NOTE — Telephone Encounter (Signed)
Pt returned call and states he will be able to make his MRI appointment.  Instructions reviewed and pt expressed understanding.

## 2019-11-20 ENCOUNTER — Ambulatory Visit (HOSPITAL_COMMUNITY)
Admission: RE | Admit: 2019-11-20 | Discharge: 2019-11-20 | Disposition: A | Discharge status: Home / Self Care | Payer: 59 | Source: Ambulatory Visit | Attending: Cardiology | Admitting: Cardiology

## 2019-11-20 ENCOUNTER — Other Ambulatory Visit: Payer: Self-pay

## 2019-11-20 DIAGNOSIS — D869 Sarcoidosis, unspecified: Secondary | ICD-10-CM | POA: Insufficient documentation

## 2019-11-20 MED ORDER — GADOBUTROL 1 MMOL/ML IV SOLN
10.0000 mL | Freq: Once | INTRAVENOUS | Status: AC | PRN
  Administered 2019-11-20: 10 mL via INTRAVENOUS

## 2019-12-10 ENCOUNTER — Telehealth: Payer: Self-pay | Admitting: Cardiology

## 2019-12-10 NOTE — Telephone Encounter (Signed)
LMOM RE: F/U Appt 

## 2019-12-13 ENCOUNTER — Telehealth: Payer: Self-pay | Admitting: Cardiology

## 2019-12-13 NOTE — Telephone Encounter (Signed)
I attempted to contact patient on 12/13/19 to schedule follow up visit with Dr. Bjorn Pippin. The patient didn't answer so I left message for patient to return call to get appointment scheduled.

## 2020-01-14 ENCOUNTER — Encounter: Payer: Self-pay | Admitting: Cardiology

## 2020-01-14 ENCOUNTER — Other Ambulatory Visit: Payer: Self-pay

## 2020-01-14 ENCOUNTER — Ambulatory Visit: Payer: 59 | Admitting: Cardiology

## 2020-01-14 VITALS — BP 128/89 | HR 76 | Ht 70.0 in | Wt 169.4 lb

## 2020-01-14 DIAGNOSIS — D869 Sarcoidosis, unspecified: Secondary | ICD-10-CM | POA: Diagnosis not present

## 2020-01-14 NOTE — Progress Notes (Signed)
Cardiology Office Note:    Date:  01/14/2020   ID:  Ryan Horton, DOB [birth year], MRN [id number]  PCP:  Etta Grandchild, MD  Cardiologist:  Little Ishikawa, MD  Electrophysiologist:  None   Referring MD: Etta Grandchild, MD   Chief Complaint  Patient presents with  . Sarcoidosis    History of Present Illness:    Ryan Horton is a 44 y.o. male with a hx of sarcoidosis who presents for follow-up.  He was referred by Dr. Yetta Barre for evaluation of abnormal EKG, initially seen on 10/07/2019.  He reports he was diagnosed with pulmonary sarcoidosis several years ago and started on treatment with prednisone.  Reports symptoms resolved and he was weaned off prednisone.  Was told disease is currently in remission and he could follow-up with pulmonology as needed.  Reports that he is active, exercises regularly.  Does push-ups every day, and runs on a treadmill twice per week for 2.5 to 3 miles.  Denies any exertional chest pain or dyspnea.  Denies any lightheadedness, palpitations, syncope, or lower extremity edema.  He underwent cardiac MRI on 11/20/2019, which showed no evidence of cardiac sarcoid.  Normal LV size and systolic function.  Since last clinic visit, he reports that he has been doing well.  He continues to exercise at least twice per week.  He will run for about 1 hour on the treadmill.  He denies any exertional chest pain or dyspnea.  He denies any lightheadedness, syncope, palpitations, or lower extremity edema.   No past medical history on file.  Past Surgical History:  Procedure Laterality Date  . KNEE ARTHROSCOPY  2011   right  . VASECTOMY  06/12/2009    Current Medications: Current Meds  Medication Sig  . Multiple Vitamin (MULTIVITAMIN) tablet Take 1 tablet by mouth daily.     Allergies:   Other   Social History   Socioeconomic History  . Marital status: Married    Spouse name: Not on file  . Number of children: Not on file  . Years of  education: Not on file  . Highest education level: Not on file  Occupational History  . 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 Smoking status: Never Smoker  . Smokeless tobacco: Never Used  Substance and Sexual Activity  . Alcohol use: Yes    Alcohol/week: 2.0 standard drinks    Types: 2 Cans of beer per week  . Drug use: No  . Sexual activity: Yes  Other Topics Concern  . Not on file  Social History Narrative   Used to work with [employer]   Social Determinants of Health   Financial Resource Strain:   . Difficulty of Paying Living Expenses:   Food Insecurity:   . Worried About Programme researcher, broadcasting/film/video in the Last Year:   . Barista in the Last Year:   Transportation Needs:   . Freight forwarder (Medical):   Marland Kitchen Lack of Transportation (Non-Medical):   Physical Activity:   . Days of Exercise per Week:   . Minutes of Exercise per Session:   Stress:   . Feeling of Stress :   Social Connections:   . Frequency of Communication with Friends and Family:   . Frequency of Social Gatherings with Friends and Family:   . Attends Religious Services:   . Active Member of Clubs or Organizations:   . Attends Banker Meetings:   .  Marital Status:      Family History: The patient's family history includes Arthritis in his father.  ROS:   Please see the history of present illness.     All other systems reviewed and are negative.  EKGs/Labs/Other Studies Reviewed:    The following studies were reviewed today:   EKG:  EKG is ordered today.  The ekg ordered today demonstrates normal sinus rhythm, rate 81, early repolarization  Recent Labs: 09/30/2019: ALT 11; BUN 12; Creatinine, Ser 1.22; Hemoglobin 14.6; Platelets 251.0; Potassium 4.1; Sodium 140; TSH 1.43  Recent Lipid Panel    Component Value Date/Time   CHOL 190 09/30/2019 0925   TRIG 43.0 09/30/2019 0925   HDL 55.90 09/30/2019 0925   CHOLHDL 3 09/30/2019 0925   VLDL 8.6 09/30/2019  0925   LDLCALC 126 (H) 09/30/2019 0925   LDLDIRECT 114.5 10/27/2010 1703    Physical Exam:    VS:  BP 128/89   Pulse 76   Ht 5\' 10"  (1.778 m)   Wt 169 lb 6.4 oz (76.8 kg)   SpO2 99%   BMI 24.31 kg/m     Wt Readings from Last 3 Encounters:  01/14/20 169 lb 6.4 oz (76.8 kg)  10/07/19 173 lb 3.2 oz (78.6 kg)  09/30/19 171 lb (77.6 kg)     GEN:  Well nourished, well developed in no acute distress HEENT: Normal NECK: No JVD; No carotid bruits LYMPHATICS: No lymphadenopathy CARDIAC: RRR, no murmurs, rubs, gallops RESPIRATORY:  Clear to auscultation without rales, wheezing or rhonchi  ABDOMEN: Soft, non-tender, non-distended MUSCULOSKELETAL:  No edema; No deformity  SKIN: Warm and dry NEUROLOGIC:  Alert and oriented x 3 PSYCHIATRIC:  Normal affect   ASSESSMENT:    1. Sarcoidosis    PLAN:    Sarcoidosis: Last seen by pulmonology in 2015.  Has been off prednisone since 2014.  Underwent cardiac MRI on 11/20/2019, which showed no evidence of cardiac sarcoid.  No further cardiac work-up recommended.  RTC as needed   Medication Adjustments/Labs and Tests Ordered: Current medicines are reviewed at length with the patient today.  Concerns regarding medicines are outlined above.  No orders of the defined types were placed in this encounter.  No orders of the defined types were placed in this encounter.   Patient Instructions  Medication Instructions:  Your physician recommends that you continue on your current medications as directed. Please refer to the Current Medication list given to you today.   Follow-Up: At [facility], you and your health needs are our priority.  As part of our continuing mission to provide you with exceptional heart care, we have created designated Provider Care Teams.  These Care Teams include your primary Cardiologist (physician) and Advanced Practice Providers (APPs -  Physician Assistants and Nurse Practitioners) who all work together to provide  you with the care you need, when you need it.  We recommend signing up for the patient portal called "MyChart".  Sign up information is provided on this After Visit Summary.  MyChart is used to connect with patients for Virtual Visits (Telemedicine).  Patients are able to view lab/test results, encounter notes, upcoming appointments, etc.  Non-urgent messages can be sent to your provider as well.   To learn more about what you can do with MyChart, go to [facility].    Your next appointment:    AS NEEDED with [email or web address]      Signed, Bjorn Pippin, MD  01/14/2020 8:51 AM    [facility]

## 2020-01-14 NOTE — Patient Instructions (Signed)
Medication Instructions:  Your physician recommends that you continue on your current medications as directed. Please refer to the Current Medication list given to you today.  Follow-Up: At [facility], you and your health needs are our priority.  As part of our continuing mission to provide you with exceptional heart care, we have created designated Provider Care Teams.  These Care Teams include your primary Cardiologist (physician) and Advanced Practice Providers (APPs -  Physician Assistants and Nurse Practitioners) who all work together to provide you with the care you need, when you need it.  We recommend signing up for the patient portal called "MyChart".  Sign up information is provided on this After Visit Summary.  MyChart is used to connect with patients for Virtual Visits (Telemedicine).  Patients are able to view lab/test results, encounter notes, upcoming appointments, etc.  Non-urgent messages can be sent to your provider as well.   To learn more about what you can do with MyChart, go to [email or web address].    Your next appointment:   AS NEEDED with Dr. Schumann 

## 2020-04-08 ENCOUNTER — Other Ambulatory Visit: Payer: Self-pay

## 2020-04-08 ENCOUNTER — Encounter: Payer: Self-pay | Admitting: Internal Medicine

## 2020-04-08 ENCOUNTER — Ambulatory Visit (INDEPENDENT_AMBULATORY_CARE_PROVIDER_SITE_OTHER): Payer: 59

## 2020-04-08 ENCOUNTER — Ambulatory Visit: Payer: 59 | Admitting: Internal Medicine

## 2020-04-08 VITALS — BP 112/72 | HR 87 | Temp 98.1°F | Ht 70.0 in | Wt 168.0 lb

## 2020-04-08 DIAGNOSIS — Z23 Encounter for immunization: Secondary | ICD-10-CM | POA: Diagnosis not present

## 2020-04-08 DIAGNOSIS — M79605 Pain in left leg: Secondary | ICD-10-CM | POA: Diagnosis not present

## 2020-04-08 NOTE — Assessment & Plan Note (Signed)
Acute Intermittent pain in his left distal upper leg, reaches 10/10 and electric-like feeling-can occur at rest or with activity No obvious cause get an x-ray of his knee and femur today Will refer to orthopedics for further evaluation

## 2020-04-08 NOTE — Patient Instructions (Addendum)
Have an xray downstairs.   Orthopedics will call you to schedule an appointment.

## 2020-04-08 NOTE — Progress Notes (Signed)
Subjective:    Patient ID: Ryan Horton, male    DOB: September 30, 1975, 44 y.o.   MRN: [id number]  HPI The patient is here for an acute visit.  Left knee pain - he was in [place] the beginning of august.  He got a shooting electric like pain in his distal anterior thigh while working out.  He was not doing anything unusual-his exercise routine was similar at home..  It was very painful. He stopped working out but the pain persisted.  He was experiencing this pain intermittently without obvious cause.  Since returning home he has worked out and not had any pain..  Sunday morning, 3 days ago, he work up and has pain there - pain 10/10.  He had not even gotten out of bed yet.  He had the pain all day and eventually went away that evening.  Yesterday evening he had it in the chest when he was sitting.  The pain lasted 5-10 minutes.  He has not had pain since then.    When he does work out he does not do anything strenuous.  The pain is localized to the 1 area his anterior distal left upper leg-just proximal to his knee.  He denies any swelling, redness or bruising.  He denies knee pain or swelling in the knee.  He denies any surgery in the knee in the past.  The pain is severe and reaches a 10/10 to the point that he tears up.  He has tried taking Advil or Aleve for the pain and it does not help.    Medications and allergies reviewed with patient and updated if appropriate.     Patient Active Problem List   Diagnosis Date Noted  . Abnormal electrocardiogram (ECG) (EKG) 09/30/2019  . Vitamin D deficiency 09/30/2019  . Eczema 06/22/2017  . Essential hypertension 05/11/2017  . OSA (obstructive sleep apnea) 04/23/2014  . Tinea versicolor 01/31/2013  . Routine general medical examination at a health care facility 10/27/2010  . Sarcoidosis 10/07/2010  . OSGOOD SCHLATTERS DISEASE 08/31/2006    Current Outpatient Medications on File Prior to Visit  Medication Sig Dispense Refill  .  Multiple Vitamin (MULTIVITAMIN) tablet Take 1 tablet by mouth daily.     No current facility-administered medications on file prior to visit.    History reviewed. No pertinent past medical history.  Past Surgical History:  Procedure Laterality Date  . KNEE ARTHROSCOPY  2011   right  . VASECTOMY  06/12/2009    Social History   Socioeconomic History  . Marital status: Married    Spouse name: Not on file  . Number of children: Not on file  . Years of education: Not on file  . Highest education level: Not on file  Occupational History  . 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 Smoking status: Never Smoker  . Smokeless tobacco: Never Used  Substance and Sexual Activity  . Alcohol use: Yes    Alcohol/week: 2.0 standard drinks    Types: 2 Cans of beer per week  . Drug use: No  . Sexual activity: Yes  Other Topics Concern  . Not on file  Social History Narrative   Used to work with [employer]   Social Determinants of Health   Financial Resource Strain:   . Difficulty of Paying Living Expenses: Not on file  Food Insecurity:   . Worried About Programme researcher, broadcasting/film/video in the Last Year: Not on file  .  Ran Out of Food in the Last Year: Not on file  Transportation Needs:   . Lack of Transportation (Medical): Not on file  . Lack of Transportation (Non-Medical): Not on file  Physical Activity:   . Days of Exercise per Week: Not on file  . Minutes of Exercise per Session: Not on file  Stress:   . Feeling of Stress : Not on file  Social Connections:   . Frequency of Communication with Friends and Family: Not on file  . Frequency of Social Gatherings with Friends and Family: Not on file  . Attends Religious Services: Not on file  . Active Member of Clubs or Organizations: Not on file  . Attends Banker Meetings: Not on file  . Marital Status: Not on file    Family History  Problem Relation Age of Onset  . Arthritis Father     Review of  Systems     Objective:   Vitals:   04/08/20 1417  BP: 112/72  Pulse: 87  Temp: 98.1 F (36.7 C)  SpO2: 98%   BP Readings from Last 3 Encounters:  04/08/20 112/72  01/14/20 128/89  10/07/19 110/80   Wt Readings from Last 3 Encounters:  04/08/20 168 lb (76.2 kg)  01/14/20 169 lb 6.4 oz (76.8 kg)  10/07/19 173 lb 3.2 oz (78.6 kg)   Body mass index is 24.11 kg/m.   Physical Exam Constitutional:      General: He is not in acute distress.    Appearance: Normal appearance. He is not ill-appearing.  HENT:     Head: Normocephalic and atraumatic.  Musculoskeletal:     Comments: Left knee with full range of motion without tenderness with palpation.  No calf tenderness.  No left upper leg tenderness.  Skin:    General: Skin is warm and dry.  Neurological:     Mental Status: He is alert.            Assessment & Plan:    See Problem List for Assessment and Plan of chronic medical problems.    This visit occurred during the SARS-CoV-2 public health emergency.  Safety protocols were in place, including screening questions prior to the visit, additional usage of staff PPE, and extensive cleaning of exam room while observing appropriate contact time as indicated for disinfecting solutions.

## 2020-04-09 ENCOUNTER — Encounter: Payer: Self-pay | Admitting: Internal Medicine

## 2020-04-09 NOTE — Addendum Note (Signed)
Addended by: Karma Ganja on: 04/09/2020 04:14 PM   Modules accepted: Orders

## 2020-04-13 ENCOUNTER — Ambulatory Visit: Payer: 59 | Admitting: Family Medicine

## 2020-04-13 ENCOUNTER — Encounter: Payer: Self-pay | Admitting: Family Medicine

## 2020-04-13 ENCOUNTER — Other Ambulatory Visit: Payer: Self-pay

## 2020-04-13 DIAGNOSIS — M79652 Pain in left thigh: Secondary | ICD-10-CM

## 2020-04-13 NOTE — Progress Notes (Signed)
**Note Ryan-Identified via Obfuscation** Office Visit Note   Patient: Ryan Horton           Date of Birth: 1976/04/01           MRN: [id number] Visit Date: 04/13/2020 Requested by: Pincus Sanes, MD [address] [place],  [place] [address] PCP: Etta Grandchild, MD  Subjective: Chief Complaint  Patient presents with  . Left Thigh - Pain    Sporadic pain in the thigh, superior to the knee-pulsating, electric pain - lasts for short periods of time. 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 Runs for exercise, but does not attribute pain to that. No relief with Aleve.    HPI: 44yo M presenting to clinic with left anterior thigh pain. Patient states that he was in training in [place], and he spontaneously felt a sudden stabbing sensation in the distal aspect of his anterior thigh. He describes it as a Librarian, academic." States the pain did not change at all with palpation, and he wasn't able to find a tenderpoint that he could rub at to alleviate the symptoms. Ne swelling or rashes in the area. Says that the pain faded within a few minutes, but then it would spontaneously return, which he states was unprovoked. He runs for exercise, but says the pain is not associated with working the knee. He says that he thought this was starting to improve on its own, but then this past weekend he experienced another, severe, episode. This time, the pain lasted for the majority of the day, and was 8-10/10 pain. He denies any back or hip pain. Pain does not travel, and he has no experienced any numbness or tingling elsewhere in the leg/thigh. No bowel/bladder dysfunction. He says he has zero pain today.               ROS:   All other systems were reviewed and are negative.  Objective: Vital Signs: There were no vitals taken for this visit.  Physical Exam:  General:  Alert and oriented, in no acute distress. Pulm:  Breathing unlabored. Psy:  Normal mood, congruent affect. Skin:  No bruises, no rashes. Right  thigh skin intact.   Right Leg exam:  General: Normal gait Standing exam: No varus or valgus deformity of the knee. Appropriate Q angle of the hip. Normal spinal curvature.   Seated Exam:  No patellar crepitus, Negative J-Sign.   Knee and hip with full ROM. No pain with FABER or FADIR.   Palpation: No tenderness to palpation over medial or lateral joint lines. No tenderness with palpation of patella or patellar tendon. No tenderness over patellar facets. No pain with palpation of anterior hip flexor tenderpoints or over pectineus. No pain within TFL.   Supine exam: No knee effusion, normal patellar mobility.   Ligamentous Exam:  No pain or laxity with anterior/posterior drawer.  No obvious Sag.  No pain or laxity with varus/valgus stress across the knee.   Meniscus:  McMurray with no pain or deep clicking.   5/5 strength with hip flexion, adduction/abduction. Knee flexion/extension, and ankle dorsi/plantarflexion.  Sensation intact throughout.     Imaging: None Today.   Assessment & Plan: 44yo M presenting to clinic with concerns of intermittent distal anterior thigh pain. Description of pain sounds neuropathic in origin, given lack of muscular tenderpoints and 'electric' nature. Negative tinel's over pectineus, and no numbness/tingling to support diagnosis of MP. No pain in lower back.  -Given intermittent nature of  pain, little benefit in EMG when asymptomatic.  -Will attempt physical therapy for back strengthening, given likelihood of neuropathic origin.  -If no improvement with PT, patient is to RTC for further evaluation, consideration of advanced imaging.      Procedures: No procedures performed  No notes on file     PMFS History: Patient Active Problem List   Diagnosis Date Noted  . Left leg pain 04/08/2020  . Abnormal electrocardiogram (ECG) (EKG) 09/30/2019  . Vitamin D deficiency 09/30/2019  . Eczema 06/22/2017  . Essential hypertension 05/11/2017  . OSA  (obstructive sleep apnea) 04/23/2014  . Tinea versicolor 01/31/2013  . Routine general medical examination at a health care facility 10/27/2010  . Sarcoidosis 10/07/2010  . OSGOOD SCHLATTERS DISEASE 08/31/2006   History reviewed. No pertinent past medical history.  Family History  Problem Relation Age of Onset  . Arthritis Father     Past Surgical History:  Procedure Laterality Date  . KNEE ARTHROSCOPY  2011   right  . VASECTOMY  06/12/2009   Social History   Occupational History  . 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 Smoking status: Never Smoker  . Smokeless tobacco: Never Used  Substance and Sexual Activity  . Alcohol use: Yes    Alcohol/week: 2.0 standard drinks    Types: 2 Cans of beer per week  . Drug use: No  . Sexual activity: Yes

## 2020-04-13 NOTE — Progress Notes (Signed)
I saw and examined the patient with Dr. Marga Hoots and agree with assessment and plan as outlined.    Intermittent left distal thigh pain.  No pain today.    Exam unremarkable, x-rays unrevealing.  Question upper lumbar disc protrusion.  Will try [facility] PT.  If symptoms persist, then x-rays and MRI of lumbar spine.

## 2020-07-21 ENCOUNTER — Other Ambulatory Visit: Payer: Self-pay | Admitting: Family Medicine

## 2020-07-21 ENCOUNTER — Ambulatory Visit: Payer: Self-pay

## 2020-07-21 ENCOUNTER — Other Ambulatory Visit: Payer: Self-pay

## 2020-07-21 DIAGNOSIS — M542 Cervicalgia: Secondary | ICD-10-CM

## 2020-09-08 ENCOUNTER — Other Ambulatory Visit: Payer: Self-pay

## 2020-09-08 ENCOUNTER — Encounter: Payer: Self-pay | Admitting: Internal Medicine

## 2020-09-08 ENCOUNTER — Ambulatory Visit: Payer: 59 | Admitting: Internal Medicine

## 2020-09-08 DIAGNOSIS — R200 Anesthesia of skin: Secondary | ICD-10-CM | POA: Diagnosis not present

## 2020-09-08 NOTE — Patient Instructions (Addendum)
I would recommend to try taking aleve scheduled for the next week or so.  We will get you in with sports medicine downstairs to check out the shoulder to see if the nerve is getting pinched in that area.   Try the carpal tunnel brace on the right hand while sleeping to see if this helps.

## 2020-09-08 NOTE — Progress Notes (Signed)
   Subjective:   Patient ID: Ryan Horton, male    DOB: [birth year], 45 y.o.   MRN: [id number]  HPI The patient is a 45 YO man coming in for numbness right 4-5th fingers and gets pain radiating from the armpit region down the arm. Worse with certain positions. Does not have any known injury to the shoulder area or old injury. Denies neck pain or problems. Started in the last year very infrequent but now in the last 2-3 weeks it is everyday and multiple times per day. While driving the arm goes numb in the 2 fingers. Denies trying anything for this.   Review of Systems  Constitutional: Negative.   HENT: Negative.   Eyes: Negative.   Respiratory: Negative for cough, chest tightness and shortness of breath.   Cardiovascular: Negative for chest pain, palpitations and leg swelling.  Gastrointestinal: Negative for abdominal distention, abdominal pain, constipation, diarrhea, nausea and vomiting.  Musculoskeletal: Positive for arthralgias.  Skin: Negative.   Neurological: Positive for numbness.  Psychiatric/Behavioral: Negative.     Objective:  Physical Exam Constitutional:      Appearance: He is well-developed.  HENT:     Head: Normocephalic and atraumatic.  Cardiovascular:     Rate and Rhythm: Normal rate and regular rhythm.  Pulmonary:     Effort: Pulmonary effort is normal. No respiratory distress.     Breath sounds: Normal breath sounds. No wheezing or rales.  Abdominal:     General: Bowel sounds are normal. There is no distension.     Palpations: Abdomen is soft.     Tenderness: There is no abdominal tenderness. There is no rebound.  Musculoskeletal:     Cervical back: Normal range of motion.     Comments: With palpation right axillary region reproduces the pain and tingling sensation down the arm.   Skin:    General: Skin is warm and dry.  Neurological:     Mental Status: He is alert and oriented to person, place, and time.     Coordination: Coordination normal.      Vitals:   09/08/20 1012  BP: 120/80  Pulse: 100  Resp: 18  Temp: 98.5 F (36.9 C)  TempSrc: Oral  SpO2: 98%  Weight: 170 lb 6.4 oz (77.3 kg)  Height: 5\' 10"  (1.778 m)    This visit occurred during the SARS-CoV-2 public health emergency.  Safety protocols were in place, including screening questions prior to the visit, additional usage of staff PPE, and extensive cleaning of exam room while observing appropriate contact time as indicated for disinfecting solutions.   Assessment & Plan:

## 2020-09-10 DIAGNOSIS — R200 Anesthesia of skin: Secondary | ICD-10-CM | POA: Insufficient documentation

## 2020-09-10 NOTE — Assessment & Plan Note (Signed)
Ulnar nerve impingement and we talked about how this could be pinched down by the wrist or in the armpit region seem most likely given symptoms on exam. He wishes to find source and address so asked him to see sports medicine to evaluate the shoulder and axillary region for treatment if needed. In the meantime schedule NSAIDs for 1-2 weeks and use carpal tunnel brace to see if this helps.

## 2020-09-14 NOTE — Progress Notes (Signed)
Subjective:    CC:  R arm pain/numbness/tingling  I, Ryan Horton, LAT, ATC, am serving as scribe for Dr. [name].  HPI: Pt is a 45 y/o male presenting w/ R arm pain from the axilla down the R arm and R 4th and 5th finger numbness. Pt noticed pain starting about a year ago that persisted for about a month and then resolved. Pt reports pain flared up about 3 weeks ago. Pt locates pain to R axilla that radiates down arm into 4th-5th fingers. Pt states yesterday pain was primarily located in triceps. 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  Neck pain: No Radiating pain: yes from R axilla down R arm R UE numbness/tingling: yes in R 4th and 5th fingers R UE weakness: no Aggravating factors: driving, sitting w/ arm resting on arm rest Treatments tried: NSAIDs, R wrist brace at night- made pain worse, but unable to sleep  Diagnostic imaging: C-spine XR- 07/21/20  Pertinent review of Systems: No fevers or chills  Relevant historical information: Sarcoidosis.  Patient is a Banker doing mostly administrative work.   Objective:    Vitals:   09/15/20 0831  BP: 126/84  Pulse: 71  SpO2: 98%   General: Well Developed, well nourished, and in no acute distress.   MSK: C-spine normal-appearing normal cervical motion.  Negative Spurling's test. Upper extremity strength reflexes and sensation are equal normal throughout.  Right shoulder normal.  Normal motion  Right elbow normal-appearing nontender negative Tinel's at cubital tunnel.  Persistent elbow flexion does not reproduce paresthesias.  Right wrist normal-appearing nontender normal wrist motion and strength.  Negative Tinel's.  Lab and Radiology Results  EXAM: CERVICAL SPINE - COMPLETE 4+ VIEW  COMPARISON:  None  FINDINGS: Straightening of cervical lordosis question muscle spasm.  Osseous mineralization normal.  Prevertebral soft tissues normal  thickness.  Mild multilevel facet degenerative changes.  Multilevel disc space narrowing and endplate spur formation.  Encroachment upon cervical neural foramina bilaterally at C3-C4 and C4-C5 by uncovertebral spurring.  No fracture, subluxation or bone destruction.  Lateral cervical flexion to the RIGHT.  Lung apices clear.  IMPRESSION: Degenerative disc and facet disease changes of the cervical spine with BILATERAL cervical neural foraminal encroachment by uncovertebral spurs at C3-C4 and C4-C5.   Electronically Signed   By: Ulyses Southward M.D.   On: 07/21/2020 14:17  I, [name], personally (independently) visualized and performed the interpretation of the images attached in this note.  X-ray images right shoulder and chest obtained today personally and independently interpreted  Right shoulder: Elevated first rib.  No significant DJD no fractures.  Chest: Lung fields are clear.  Await formal radiology review   Impression and Recommendations:    Assessment and Plan: 45 y.o. male with right arm paresthesias in ulnar nerve distribution.  Distribution of paresthesias R pinky finger and ulnar side of fourth digit which is more consistent with ulnar nerve that needs a cervical nerve root.  However his symptoms extend proximally beyond the elbow which is not characteristic of cubital tunnel syndrome.  He may have an issue with the ulnar nerve in the upper arm or even in the brachial plexus.  He may have thoracic outlet syndrome Raynaud's or other etiology.  Plan for course of prednisone gabapentin and physical therapy.  Recheck back in about a month.  If not improving consider nerve conduction study.  PDMP not reviewed this encounter. Orders Placed This Encounter  Procedures  . DG Shoulder Right    Standing Status:   Future    Number of Occurrences:   1    Standing Expiration Date:   09/15/2021    Order Specific Question:   Reason for Exam (SYMPTOM  OR DIAGNOSIS  REQUIRED)    Answer:   chronic right arm pain    Order Specific Question:   Preferred imaging location?    Answer:   [facility]  . DG Chest 2 View    Standing Status:   Future    Number of Occurrences:   1    Standing Expiration Date:   09/15/2021    Order Specific Question:   Reason for Exam (SYMPTOM  OR DIAGNOSIS REQUIRED)    Answer:   eval poss thoracic outlet syndrome right arm    Order Specific Question:   Preferred imaging location?    Answer:   [facility]  . Ambulatory referral to Physical Therapy    Referral Priority:   Routine    Referral Type:   Physical Medicine    Referral Reason:   Specialty Services Required    Requested Specialty:   Physical Therapy   Meds ordered this encounter  Medications  . predniSONE (DELTASONE) 50 MG tablet    Sig: Take 1 tablet (50 mg total) by mouth daily.    Dispense:  5 tablet    Refill:  0  . gabapentin (NEURONTIN) 300 MG capsule    Sig: Take 1 capsule (300 mg total) by mouth 3 (three) times daily as needed.    Dispense:  90 capsule    Refill:  3    Discussed warning signs or symptoms. Please see discharge instructions. Patient expresses understanding.   The above documentation has been reviewed and is accurate and complete [name], M.D.

## 2020-09-15 ENCOUNTER — Other Ambulatory Visit: Payer: Self-pay

## 2020-09-15 ENCOUNTER — Ambulatory Visit (INDEPENDENT_AMBULATORY_CARE_PROVIDER_SITE_OTHER): Payer: 59

## 2020-09-15 ENCOUNTER — Ambulatory Visit: Payer: 59 | Admitting: Family Medicine

## 2020-09-15 ENCOUNTER — Ambulatory Visit: Payer: Self-pay

## 2020-09-15 VITALS — BP 126/84 | HR 71 | Ht 70.0 in | Wt 169.4 lb

## 2020-09-15 DIAGNOSIS — G5621 Lesion of ulnar nerve, right upper limb: Secondary | ICD-10-CM | POA: Diagnosis not present

## 2020-09-15 DIAGNOSIS — M79621 Pain in right upper arm: Secondary | ICD-10-CM

## 2020-09-15 MED ORDER — PREDNISONE 50 MG PO TABS: 50.0000 mg | ORAL_TABLET | Freq: Every day | ORAL | 0 refills | Status: DC

## 2020-09-15 MED ORDER — GABAPENTIN 300 MG PO CAPS: 300.0000 mg | ORAL_CAPSULE | Freq: Three times a day (TID) | ORAL | 3 refills | Status: DC | PRN

## 2020-09-15 NOTE — Patient Instructions (Signed)
Thank you for coming in today.  I've referred you to Physical Therapy.  Let us know if you don't hear from them in one week.  Please get an Xray today before you leave  Try the gabapentin mostly at bedtime at first. See how it goes.  Take prednisone short term.   Recheck in about 1 month.   If not improving next step is likely nerve conduction study.   I think you have a problem with the ulnar nerve likely in the chest possibly thoracic outlet syndrome (nerve type).   Keep me updated.  More to do if needed.

## 2020-09-16 NOTE — Progress Notes (Signed)
Chest x-ray does not show any acute findings.  Some mild degenerative changes present in the thoracic spine.  No cervical ribs which are a risk factor for thoracic outlet syndrome.  Overall this is a reassuring chest x-ray.

## 2020-09-16 NOTE — Progress Notes (Signed)
Right shoulder x-ray looks normal to radiology

## 2020-09-17 ENCOUNTER — Encounter: Payer: Self-pay | Admitting: Physical Therapy

## 2020-09-17 ENCOUNTER — Ambulatory Visit (INDEPENDENT_AMBULATORY_CARE_PROVIDER_SITE_OTHER): Payer: 59 | Admitting: Physical Therapy

## 2020-09-17 ENCOUNTER — Other Ambulatory Visit: Payer: Self-pay

## 2020-09-17 ENCOUNTER — Telehealth: Payer: Self-pay | Admitting: Physical Therapy

## 2020-09-17 DIAGNOSIS — M79621 Pain in right upper arm: Secondary | ICD-10-CM

## 2020-09-17 DIAGNOSIS — R29818 Other symptoms and signs involving the nervous system: Secondary | ICD-10-CM | POA: Diagnosis not present

## 2020-09-17 NOTE — Telephone Encounter (Signed)
Called pt to relay his XR results and pt states that he does not want to take the Gabapentin due to some of it's side effects, particularly the breathing side effects.  He asks if there is another medication that might do something similar w/ o the breathing side effects.  If so, please prescribe to his pharmacy on record.

## 2020-09-17 NOTE — Therapy (Signed)
[facility] [address] [place], [place], [address] Phone: [phone]   Fax:  [phone]  Physical Therapy Evaluation  Patient Details  Name: Ryan Horton MRN: [id number] Date of Birth: 09/16/1975 Referring Provider (PT): Rodolph Bong, MD   Encounter Date: 09/17/2020   PT End of Session - 09/17/20 1504    Visit Number 1    Number of Visits 12    Date for PT Re-Evaluation 10/29/20    PT Start Time 1345    PT Stop Time 1425    PT Time Calculation (min) 40 min    Activity Tolerance Patient tolerated treatment well    Behavior During Therapy [facility] for tasks assessed/performed           History reviewed. No pertinent past medical history.  Past Surgical History:  Procedure Laterality Date  . KNEE ARTHROSCOPY  2011   right  . VASECTOMY  06/12/2009    There were no vitals filed for this visit.    Subjective Assessment - 09/17/20 1308    Subjective Pt is a 45 y/o male who presents to OPPT for RUE pain and numbness x 1 month.  He notices that symptoms are exacerbated by prolonged sitting with arm down.  He reports if he moves arm when symptoms start numbness will not occur.    Diagnostic tests xrays: Degenerative disc and facet disease changes of the cervical spine  with BILATERAL cervical neural foraminal encroachment by  uncovertebral spurs at C3-C4 and C4-C5.    Patient Stated Goals improve pain - he's skeptical that PT will help    Currently in Pain? Yes    Pain Score 0-No pain   up to 5/10   Pain Location Arm    Pain Orientation Right    Pain Descriptors / Indicators Numbness;Dull    Pain Type Acute pain    Pain Onset More than a month ago    Pain Frequency Intermittent    Aggravating Factors  sitting with arm down, using the mouse    Pain Relieving Factors movement              [facility] PT Assessment - 09/17/20 1312      Assessment   Medical Diagnosis M79.621 (ICD-10-CM) - Pain in right upper arm    Referring  Provider (PT) Rodolph Bong, MD    Onset Date/Surgical Date --   1 month ago   Hand Dominance Right    Next MD Visit 10/15/20    Prior Therapy following knee surgery      Precautions   Precautions None      Restrictions   Weight Bearing Restrictions No      Balance Screen   Has the patient fallen in the past 6 months Yes    How many times? while at work, slipped on ice at work    Has the patient had a decrease in activity level because of a fear of falling?  No    Is the patient reluctant to leave their home because of a fear of falling?  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    Leisure work, spend time with family; exercise  2 days/wk (treadmill, basketball, some weights)      Cognition   Overall Cognitive Status Within Functional Limits for tasks assessed      Observation/Other Assessments   Focus on Therapeutic Outcomes (FOTO)  88 (predicted 87)      Posture/Postural Control   Posture/Postural Control Postural limitations    Postural Limitations Rounded Shoulders;Forward head      ROM / Strength   AROM / PROM / Strength AROM;Strength      AROM   AROM Assessment Site Cervical    Cervical Flexion 60    Cervical Extension 43    Cervical - Right Side Bend 35    Cervical - Left Side Bend 42      Strength   Overall Strength Comments bil shoulders/elbows 5/5; Rt grip 115#; Lt grip 111# (avg of 3 trials)      Special Tests    Special Tests Cervical    Other special tests seated at computer x 3 min increased symptoms in axilla and posterior arm/tricep - resolved with repositioning    Cervical Tests Spurling's;Dictraction      Spurling's   Findings Negative      Distraction Test   Comment ?  slight improvement in symptoms - no significant change                      Objective measurements completed on examination: See above findings.       [facility] Adult PT Treatment/Exercise - 09/17/20 1312      Exercises   Exercises Other Exercises    Other Exercises  see pt instructions - performed 1-3 reps of each                  PT Education - 09/17/20 1427    Education Details HEP    Person(s) Educated Patient    Methods Explanation;Demonstration;Handout    Comprehension Verbalized understanding;Returned demonstration;Need further instruction            PT Short Term Goals - 09/17/20 1456      PT SHORT TERM GOAL #1   Title Independent with initial HEP    Status New    Target Date 10/29/20             PT Long Term Goals - 09/17/20 1457      PT LONG TERM GOAL #1   Title Independent with final HEP    Status New    Target Date 10/29/20      PT LONG TERM GOAL #2   Title FOTO score maintained to maintain current function    Status New    Target Date 10/29/20      PT LONG TERM GOAL #3   Title report 50% reduction in numbness for improved function    Status New    Target Date 10/29/20                  Plan - 09/17/20 1427    Clinical Impression Statement Pt is a 45 y/o male who presents to OPPT for acute onset of RUE pain and numbness x 1 month.  Clinical presentation with some indications of peripheral nerve impingement but cannot rule out cervical involvement.  He will benefit from PT to address deficits listed.    Personal Factors and Comorbidities Profession    Examination-Activity Limitations Sit    Examination-Participation Restrictions Community Activity;Occupation;Driving    Stability/Clinical Decision Making Evolving/Moderate complexity    Clinical Decision Making Moderate  Rehab Potential Good    PT Frequency 2x / week   1-2x/wk   PT Duration 6 weeks    PT Treatment/Interventions ADLs/Self Care Home  Management;Cryotherapy;Electrical Stimulation;Iontophoresis 4mg /ml Dexamethasone;Moist Heat;Traction;Therapeutic exercise;Therapeutic activities;Functional mobility training;Ultrasound;Neuromuscular re-education;Patient/family education;Manual techniques;Vasopneumatic Device;Taping;Dry needling;Passive range of motion    PT Next Visit Plan review HEP, see how symptoms are, progress per POC    PT Home Exercise Plan Access Code: [email or web address]           Patient will benefit from skilled therapeutic intervention in order to improve the following deficits and impairments:  Decreased range of motion,Impaired UE functional use,Pain,Impaired sensation,Postural dysfunction  Visit Diagnosis: Pain in right upper arm - Plan: PT plan of care cert/re-cert  Other symptoms and signs involving the nervous system - Plan: PT plan of care cert/re-cert     Problem List Patient Active Problem List   Diagnosis Date Noted  . Numbness of finger 09/10/2020  . Left leg pain 04/08/2020  . Abnormal electrocardiogram (ECG) (EKG) 09/30/2019  . Vitamin D deficiency 09/30/2019  . Eczema 06/22/2017  . Essential hypertension 05/11/2017  . OSA (obstructive sleep apnea) 04/23/2014  . Tinea versicolor 01/31/2013  . Routine general medical examination at a health care facility 10/27/2010  . Sarcoidosis 10/07/2010  . 12/07/2010 [facility] DISEASE 08/31/2006      09/02/2006, PT, DPT 09/17/20 3:04 PM    [facility] [address] [place], [place], [place] Phone: [phone]   Fax:  [phone]  Name: EMON LANCE MRN: Carolan Shiver Date of Birth: 1975/08/16

## 2020-09-17 NOTE — Patient Instructions (Signed)
Access Code: [email or web address] URL: [email or web address] Date: 09/17/2020 Prepared by: Moshe Cipro  Exercises Doorway Pec Stretch at 90 Degrees Abduction - 1 x daily - 7 x weekly - 1 sets - 3 reps - 10-15 sec hold Doorway Pec Stretch at 120 Degrees Abduction - 1 x daily - 7 x weekly - 1 sets - 3 reps - 10-15 sec hold Ulnar Nerve Flossing - 1 x daily - 7 x weekly - 1 sets - 10 reps - 3-5 sec hold

## 2020-09-18 MED ORDER — PREGABALIN 75 MG PO CAPS: 75.0000 mg | ORAL_CAPSULE | Freq: Two times a day (BID) | ORAL | 3 refills | Status: DC | PRN

## 2020-09-18 NOTE — Addendum Note (Signed)
Addended by: Rodolph Bong on: 09/18/2020 07:32 AM   Modules accepted: Orders

## 2020-09-18 NOTE — Telephone Encounter (Signed)
We will try Lyrica.  Please let me know if this is an issue.  Lyrica sent to the same pharmacy.  [facility] on [address].

## 2020-09-18 NOTE — Telephone Encounter (Signed)
Sent pt a MyChart message letting him know of the switch to his rx.

## 2020-09-21 ENCOUNTER — Encounter: Payer: Self-pay | Admitting: Physical Therapy

## 2020-09-21 ENCOUNTER — Other Ambulatory Visit: Payer: Self-pay

## 2020-09-21 ENCOUNTER — Ambulatory Visit: Payer: 59 | Admitting: Physical Therapy

## 2020-09-21 DIAGNOSIS — M79621 Pain in right upper arm: Secondary | ICD-10-CM

## 2020-09-21 DIAGNOSIS — R29818 Other symptoms and signs involving the nervous system: Secondary | ICD-10-CM | POA: Diagnosis not present

## 2020-09-21 NOTE — Therapy (Addendum)
[facility] [place], [place], [address] Phone: [phone]   Fax:  [phone]  Physical Therapy Treatment/Discharge Summary  Patient Details  Name: Ryan Horton MRN: [id number] Date of Birth: [birth year] Referring Provider (PT): Gregor Hams, MD   Encounter Date: 09/21/2020   PT End of Session - 09/21/20 1139    Visit Number 2    Number of Visits 12    Date for PT Re-Evaluation 10/29/20    PT Start Time 1108    PT Stop Time 1136    PT Time Calculation (min) 28 min    Activity Tolerance Patient tolerated treatment well    Behavior During Therapy [facility] for tasks assessed/performed           History reviewed. No pertinent past medical history.  Past Surgical History:  Procedure Laterality Date   KNEE ARTHROSCOPY  2011   right   VASECTOMY  06/12/2009    There were no vitals filed for this visit.   Subjective Assessment - 09/21/20 1108    Subjective hasn't had any numbness since last week - hasn't sat at computer much since though and didn't have any issues.  did drive over the weekend and no difficulty    Diagnostic tests xrays: Degenerative disc and facet disease changes of the cervical spine  with BILATERAL cervical neural foraminal encroachment by  uncovertebral spurs at C3-C4 and C4-C5.    Patient Stated Goals improve pain - he's skeptical that PT will help    Currently in Pain? No/denies                             [facility] Adult PT Treatment/Exercise - 09/21/20 1110      Therapeutic Activites    Therapeutic Activities Work [employer]    Work Simulation seated "computer work" x 5 min without symptoms      Exercises   Exercises Shoulder      Shoulder Exercises: ROM/Strengthening   UBE (Upper Arm Bike) L5 x 8 min (alt fwd/bwd every 2 min)    Lat Pull Limitations 55# 3x10    Cybex Row Limitations 55# 3x10      Shoulder Exercises: Stretch   Other Shoulder Stretches overhead and mid  doorway stretch 3x30 sec                    PT Short Term Goals - 09/21/20 1140      PT SHORT TERM GOAL #1   Title Independent with initial HEP    Status Achieved    Target Date 10/29/20             PT Long Term Goals - 09/21/20 1140      PT LONG TERM GOAL #1   Title Independent with final HEP    Status On-going    Target Date 10/29/20      PT LONG TERM GOAL #2   Title FOTO score maintained to maintain current function    Status On-going      PT LONG TERM GOAL #3   Title report 50% reduction in numbness for improved function    Status Achieved                 Plan - 09/21/20 1140    Clinical Impression Statement Pt has met STG #1 and LTG #3 at this time and overall reports no episodes of numbess since last session.  Unable to reproduce  today as well with work simulated activity and exercise.  Anticipate he may not need additional PT visits, but will keep appts in case symptoms return.    PT Frequency 2x / week   1-2x/wk   PT Duration 6 weeks    PT Treatment/Interventions ADLs/Self Care Home Management;Cryotherapy;Electrical Stimulation;Iontophoresis 52m/ml Dexamethasone;Moist Heat;Traction;Therapeutic exercise;Therapeutic activities;Functional mobility training;Ultrasound;Neuromuscular re-education;Patient/family education;Manual techniques;Vasopneumatic Device;Taping;Dry needling;Passive range of motion    PT Next Visit Plan reassess if he returns, otherwise plan for d/c as he's doing better    PT Home Exercise Plan [facility]          Patient will benefit from skilled therapeutic intervention in order to improve the following deficits and impairments:  Decreased range of motion,Impaired UE functional use,Pain,Impaired sensation,Postural dysfunction  Visit Diagnosis: Pain in right upper arm  Other symptoms and signs involving the nervous system     Problem List Patient Active Problem List   Diagnosis Date Noted   Numbness of  finger 09/10/2020   Left leg pain 04/08/2020   Abnormal electrocardiogram (ECG) (EKG) 09/30/2019   Vitamin D deficiency 09/30/2019   Eczema 06/22/2017   Essential hypertension 05/11/2017   OSA (obstructive sleep apnea) 04/23/2014   Tinea versicolor 01/31/2013   Routine general medical examination at a health care facility 10/27/2010   Sarcoidosis 10/07/2010   [facility] 08[facility]      SLaureen Abrahams PT, DPT 09/21/20 11:43 AM    [facility] [address] [place] [address]Phone: [phone]  Fax:  [phone] Name: Ryan Horton MRN: [id number] of Birth: 305-20-1977   PHYSICAL THERAPY DISCHARGE SUMMARY  Visits from Start of Care: 2  Current functional level related to goals / functional outcomes: See above   Remaining deficits: none   Education / Equipment: HEP  Plan: Patient agrees to discharge.  Patient goals were partially met. Patient is being discharged due to meeting the stated rehab goals.  ?????    SLaureen Abrahams PT, DPT 12/03/20 11:35 AM  [facility] [address] [place] [address]Phone: [phone]  Fax:  [phone]

## 2020-09-30 ENCOUNTER — Encounter: Payer: 59 | Admitting: Physical Therapy

## 2020-10-05 ENCOUNTER — Encounter: Payer: Self-pay | Admitting: Physical Therapy

## 2020-10-12 ENCOUNTER — Encounter: Payer: Self-pay | Admitting: Physical Therapy

## 2020-10-14 NOTE — Progress Notes (Deleted)
   I, Philbert Riser, LAT, ATC acting as a scribe for [name], MD.  Ryan Horton is a 45 y.o. male who presents to [facility] today for f/u R upper arm pain ongoing flare-up since late Feb. Pt was last seen by Dr. Dorna Mai on 09/15/20 and prescribed prednisone, gabapentin, and referred to PT of which he's completed 2 visits. When pt was called on 09/17/20 to discuss XR results, he reported not wanting to take the gabapentin due to side effects and requested a new medication. Dr. Denyse Amass switched the rx to Lyrica. Today, pt reports   Dx imaging: 09/15/20 R shoulder & chest XR  07/21/20 C-spine XR  Pertinent review of systems: ***  Relevant historical information: ***   Exam:  There were no vitals taken for this visit. General: Well Developed, well nourished, and in no acute distress.   MSK: ***    Lab and Radiology Results No results found for this or any previous visit (from the past 72 hour(s)). No results found.     Assessment and Plan: 45 y.o. male with ***   PDMP not reviewed this encounter. No orders of the defined types were placed in this encounter.  No orders of the defined types were placed in this encounter.    Discussed warning signs or symptoms. Please see discharge instructions. Patient expresses understanding.   ***

## 2020-10-15 ENCOUNTER — Ambulatory Visit: Payer: 59 | Admitting: Family Medicine

## 2021-11-25 IMAGING — DX DG SHOULDER 2+V*R*
3 series · 3 of 3 positions shown · non-contrast
Comparison: None.

CLINICAL DATA: Chronic right arm pain.

EXAM:
RIGHT SHOULDER - 2+ VIEW

[shoulder ap (1 of 2)]
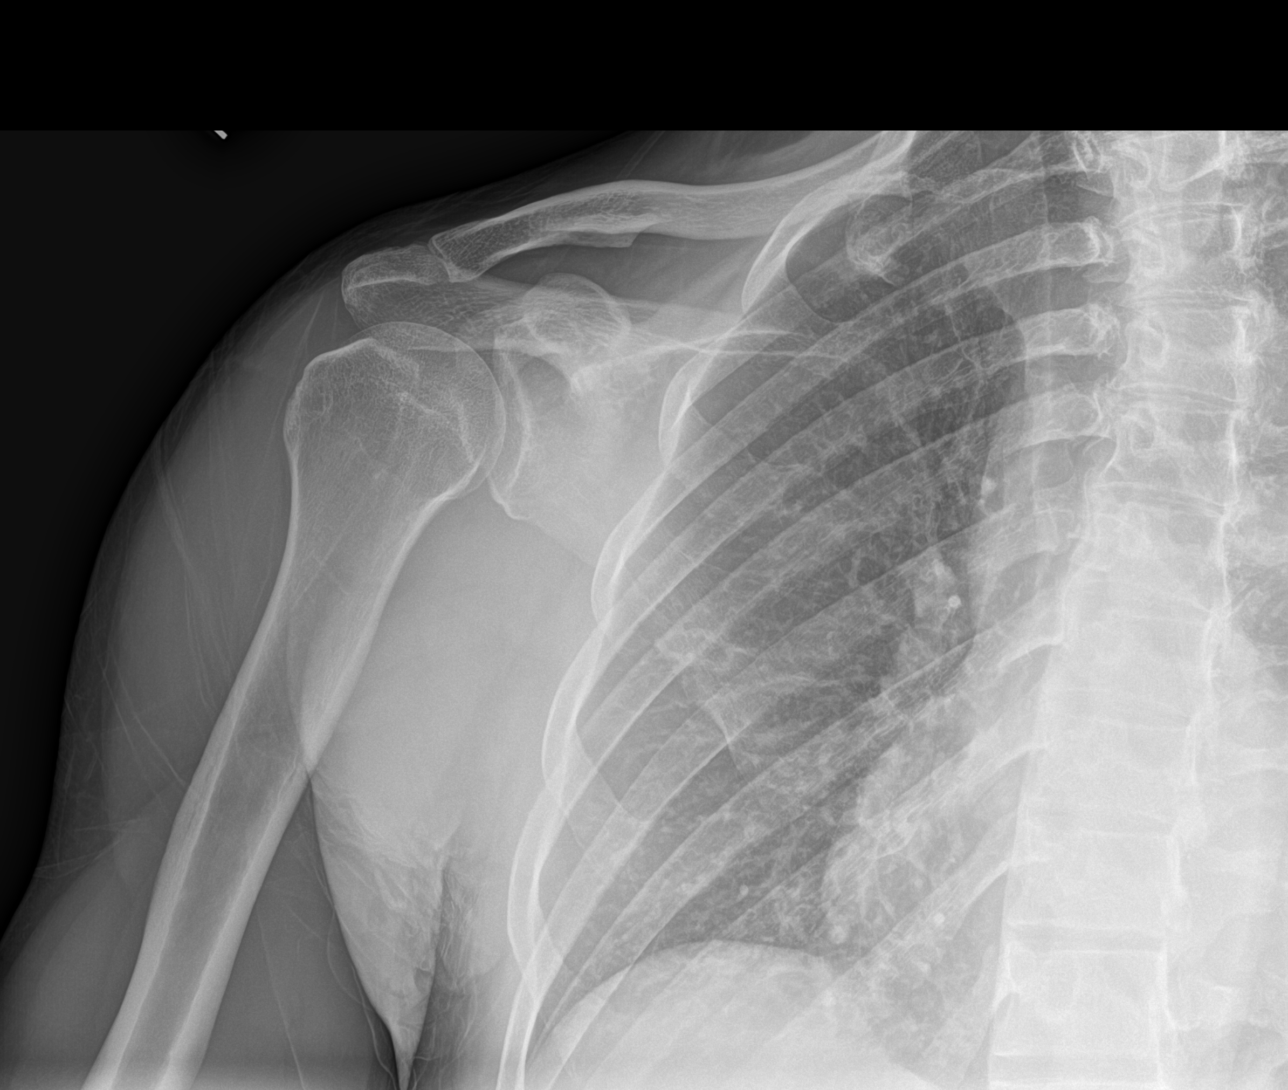

[shoulder ap (2 of 2)]
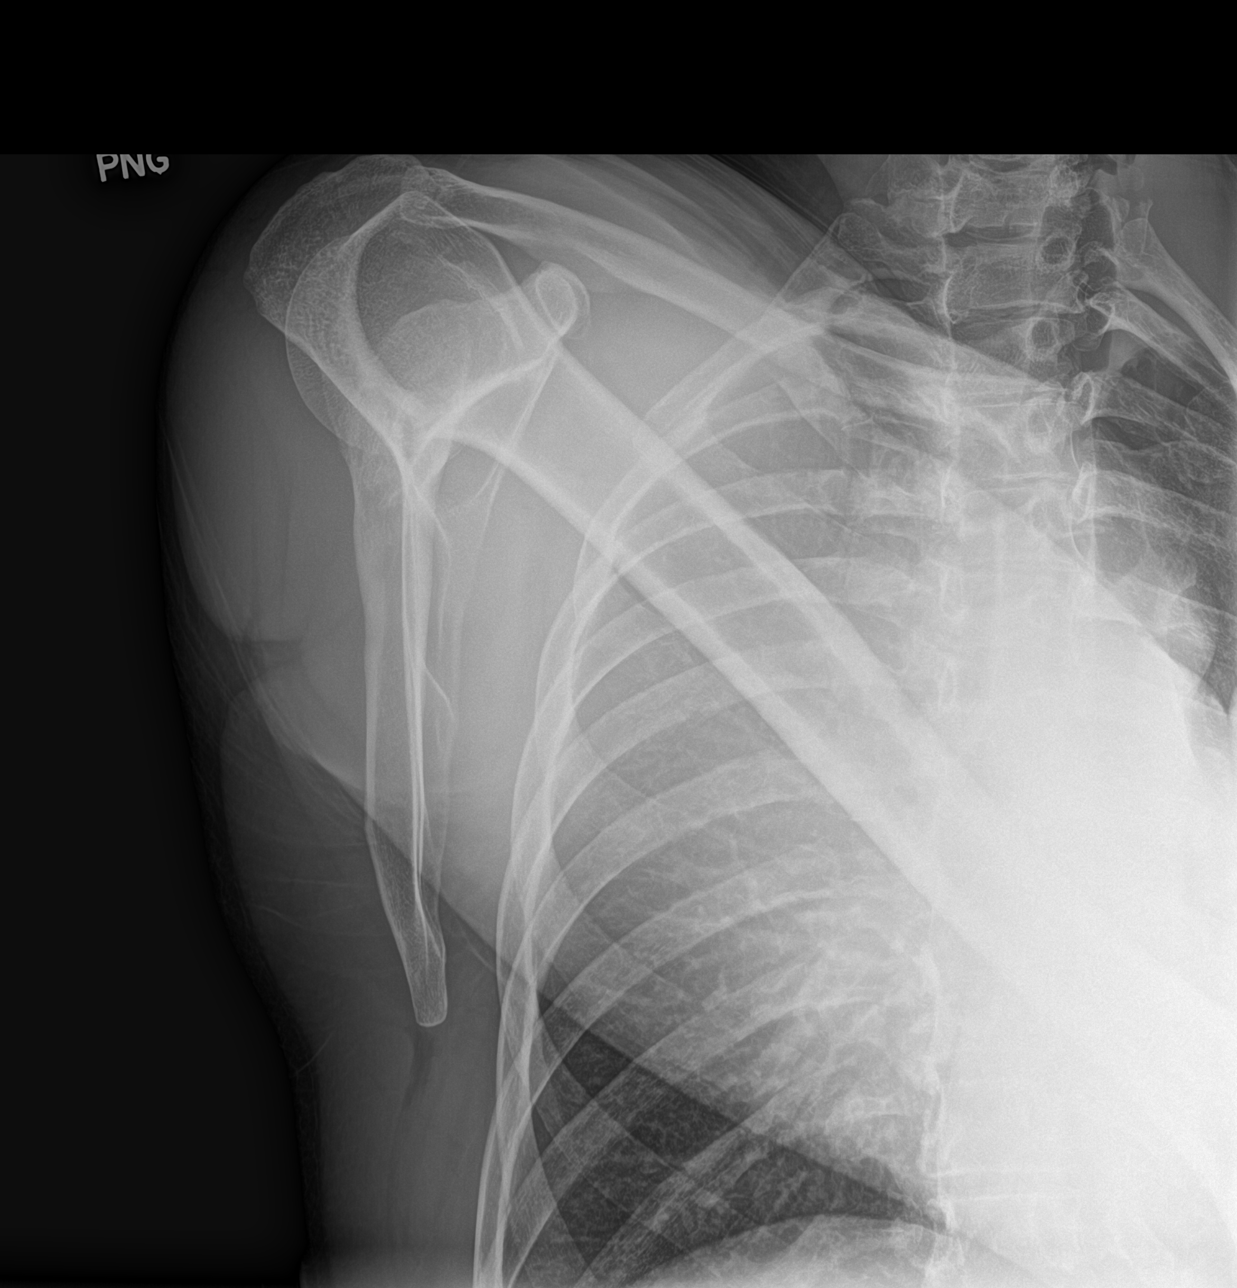

[shoulder axial]
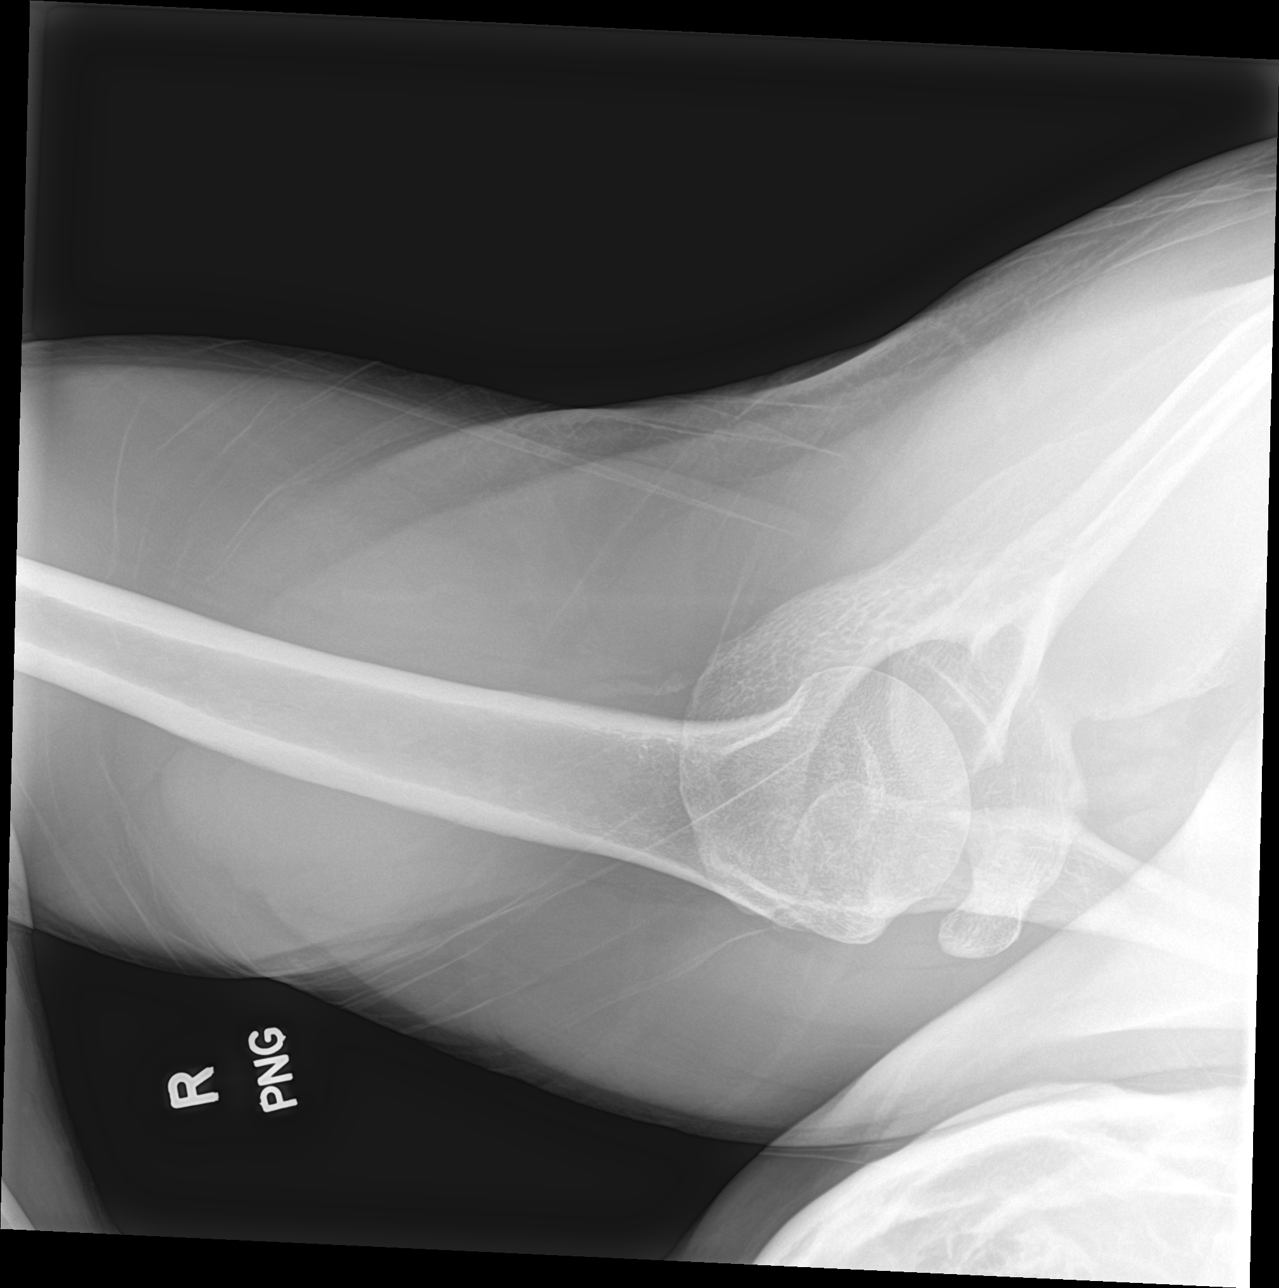

[3 of 3 positions shown; findings below may reference images not displayed]

FINDINGS: There is no evidence of fracture or dislocation. There is no
evidence of arthropathy or other focal bone abnormality. Soft
tissues are unremarkable.
IMPRESSION: Negative.

## 2022-09-27 ENCOUNTER — Encounter: Payer: Self-pay | Admitting: Internal Medicine

## 2022-09-27 ENCOUNTER — Ambulatory Visit (INDEPENDENT_AMBULATORY_CARE_PROVIDER_SITE_OTHER): Payer: 59 | Admitting: Internal Medicine

## 2022-09-27 VITALS — BP 122/84 | HR 72 | Temp 98.4°F | Resp 16 | Ht 70.0 in | Wt 170.0 lb

## 2022-09-27 DIAGNOSIS — Z1211 Encounter for screening for malignant neoplasm of colon: Secondary | ICD-10-CM

## 2022-09-27 DIAGNOSIS — Z23 Encounter for immunization: Secondary | ICD-10-CM | POA: Diagnosis not present

## 2022-09-27 DIAGNOSIS — Z0000 Encounter for general adult medical examination without abnormal findings: Secondary | ICD-10-CM | POA: Diagnosis not present

## 2022-09-27 DIAGNOSIS — Z125 Encounter for screening for malignant neoplasm of prostate: Secondary | ICD-10-CM

## 2022-09-27 DIAGNOSIS — Z1159 Encounter for screening for other viral diseases: Secondary | ICD-10-CM | POA: Insufficient documentation

## 2022-09-27 DIAGNOSIS — G5601 Carpal tunnel syndrome, right upper limb: Secondary | ICD-10-CM

## 2022-09-27 LAB — LIPID PANEL
Cholesterol: 205 mg/dL — ABNORMAL HIGH (ref 0–200)
HDL: 57.6 mg/dL (ref >39.00)
LDL Cholesterol: 132 mg/dL — ABNORMAL HIGH (ref 0–99)
NonHDL: 147.33
Total CHOL/HDL Ratio: 4
Triglycerides: 75 mg/dL (ref 0.0–149.0)
VLDL: 15 mg/dL (ref 0.0–40.0)

## 2022-09-27 LAB — PSA: PSA: 1.01 ng/mL (ref 0.10–4.00)

## 2022-09-27 NOTE — Patient Instructions (Signed)
Health Maintenance, Male Adopting a healthy lifestyle and getting preventive care are important in promoting health and wellness. Ask your health care provider about: The right schedule for you to have regular tests and exams. Things you can do on your own to prevent diseases and keep yourself healthy. What should I know about diet, weight, and exercise? Eat a healthy diet  Eat a diet that includes plenty of vegetables, fruits, low-fat dairy products, and lean protein. Do not eat a lot of foods that are high in solid fats, added sugars, or sodium. Maintain a healthy weight Body mass index (BMI) is a measurement that can be used to identify possible weight problems. It estimates body fat based on height and weight. Your health care provider can help determine your BMI and help you achieve or maintain a healthy weight. Get regular exercise Get regular exercise. This is one of the most important things you can do for your health. Most adults should: Exercise for at least 150 minutes each week. The exercise should increase your heart rate and make you sweat (moderate-intensity exercise). Do strengthening exercises at least twice a week. This is in addition to the moderate-intensity exercise. Spend less time sitting. Even light physical activity can be beneficial. Watch cholesterol and blood lipids Have your blood tested for lipids and cholesterol at 46 years of age, then have this test every 5 years. You may need to have your cholesterol levels checked more often if: Your lipid or cholesterol levels are high. You are older than 46 years of age. You are at high risk for heart disease. What should I know about cancer screening? Many types of cancers can be detected early and may often be prevented. Depending on your health history and family history, you may need to have cancer screening at various ages. This may include screening for: Colorectal cancer. Prostate cancer. Skin cancer. Lung  cancer. What should I know about heart disease, diabetes, and high blood pressure? Blood pressure and heart disease High blood pressure causes heart disease and increases the risk of stroke. This is more likely to develop in people who have high blood pressure readings or are overweight. Talk with your health care provider about your target blood pressure readings. Have your blood pressure checked: Every 3-5 years if you are 18-39 years of age. Every year if you are 40 years old or older. If you are between the ages of 65 and 75 and are a current or former smoker, ask your health care provider if you should have a one-time screening for abdominal aortic aneurysm (AAA). Diabetes Have regular diabetes screenings. This checks your fasting blood sugar level. Have the screening done: Once every three years after age 45 if you are at a normal weight and have a low risk for diabetes. More often and at a younger age if you are overweight or have a high risk for diabetes. What should I know about preventing infection? Hepatitis B If you have a higher risk for hepatitis B, you should be screened for this virus. Talk with your health care provider to find out if you are at risk for hepatitis B infection. Hepatitis C Blood testing is recommended for: Everyone born from 1945 through 1965. Anyone with known risk factors for hepatitis C. Sexually transmitted infections (STIs) You should be screened each year for STIs, including gonorrhea and chlamydia, if: You are sexually active and are younger than 46 years of age. You are older than 46 years of age and your   health care provider tells you that you are at risk for this type of infection. Your sexual activity has changed since you were last screened, and you are at increased risk for chlamydia or gonorrhea. Ask your health care provider if you are at risk. Ask your health care provider about whether you are at high risk for HIV. Your health care provider  may recommend a prescription medicine to help prevent HIV infection. If you choose to take medicine to prevent HIV, you should first get tested for HIV. You should then be tested every 3 months for as long as you are taking the medicine. Follow these instructions at home: Alcohol use Do not drink alcohol if your health care provider tells you not to drink. If you drink alcohol: Limit how much you have to 0-2 drinks a day. Know how much alcohol is in your drink. In the [place] one drink equals one 12 oz bottle of beer (355 mL), one 5 oz glass of wine (148 mL), or one 1 oz glass of hard liquor (44 mL). Lifestyle Do not use any products that contain nicotine or tobacco. These products include cigarettes, chewing tobacco, and vaping devices, such as e-cigarettes. If you need help quitting, ask your health care provider. Do not use street drugs. Do not share needles. Ask your health care provider for help if you need support or information about quitting drugs. General instructions Schedule regular health, dental, and eye exams. Stay current with your vaccines. Tell your health care provider if: You often feel depressed. You have ever been abused or do not feel safe at home. Summary Adopting a healthy lifestyle and getting preventive care are important in promoting health and wellness. Follow your health care provider's instructions about healthy diet, exercising, and getting tested or screened for diseases. Follow your health care provider's instructions on monitoring your cholesterol and blood pressure. This information is not intended to replace advice given to you by your health care provider. Make sure you discuss any questions you have with your health care provider. Document Revised: 11/09/2020 Document Reviewed: 11/09/2020 Elsevier Patient Education  2023 Elsevier Inc.  

## 2022-09-27 NOTE — Progress Notes (Signed)
Subjective:  Patient ID: Sarajane Jews, male    DOB: 02/09/1976  Age: 47 y.o. MRN: [id number]  CC: Annual Exam   HPI Kandace Blitz II presents for a CPX and f/up ---  He is active and denies chest pain, shortness of breath, diaphoresis, or edema.  Outpatient Medications Prior to Visit  Medication Sig Dispense Refill   predniSONE (DELTASONE) 50 MG tablet Take 1 tablet (50 mg total) by mouth daily. 5 tablet 0   pregabalin (LYRICA) 75 MG capsule Take 1 capsule (75 mg total) by mouth 2 (two) times daily as needed (nerve pain). 60 capsule 3   No facility-administered medications prior to visit.    ROS Review of Systems  Constitutional: Negative.  Negative for appetite change, fatigue and unexpected weight change.  HENT: Negative.    Eyes: Negative.   Respiratory: Negative.  Negative for cough, shortness of breath and wheezing.   Cardiovascular:  Negative for chest pain, palpitations and leg swelling.  Gastrointestinal: Negative.  Negative for abdominal pain, constipation, diarrhea, nausea and vomiting.  Endocrine: Negative.   Genitourinary: Negative.   Musculoskeletal:  Negative for arthralgias, back pain, myalgias and neck pain.  Skin: Negative.  Negative for color change and pallor.  Neurological:  Negative for dizziness and weakness.       "Numbness right hand/pinky/thumb"  Hematological:  Negative for adenopathy. Does not bruise/bleed easily.  Psychiatric/Behavioral: Negative.      Objective:  BP 122/84 (BP Location: Left Arm, Patient Position: Sitting, Cuff Size: Large)   Pulse 72   Temp 98.4 F (36.9 C) (Oral)   Resp 16   Ht 5\' 10"  (1.778 m)   Wt 170 lb (77.1 kg)   SpO2 97%   BMI 24.39 kg/m   BP Readings from Last 3 Encounters:  09/27/22 122/84  09/15/20 126/84  09/08/20 120/80    Wt Readings from Last 3 Encounters:  09/27/22 170 lb (77.1 kg)  09/15/20 169 lb 6.4 oz (76.8 kg)  09/08/20 170 lb 6.4 oz (77.3 kg)    Physical Exam Vitals reviewed.   Constitutional:      Appearance: Normal appearance.  HENT:     Nose: Nose normal.     Mouth/Throat:     Mouth: Mucous membranes are moist.  Eyes:     General: No scleral icterus.    Conjunctiva/sclera: Conjunctivae normal.  Cardiovascular:     Rate and Rhythm: Normal rate and regular rhythm.     Heart sounds: No murmur heard. Pulmonary:     Effort: Pulmonary effort is normal.     Breath sounds: No stridor. No wheezing, rhonchi or rales.  Abdominal:     General: Abdomen is flat.     Palpations: There is no mass.     Tenderness: There is no abdominal tenderness. There is no guarding.     Hernia: No hernia is present. There is no hernia in the left inguinal area or right inguinal area.  Genitourinary:    Pubic Area: No rash.      Penis: Normal and circumcised.      Testes: Normal.     Epididymis:     Right: Normal.     Left: Normal.     Prostate: Normal. Not enlarged, not tender and no nodules present.     Rectum: Normal. Guaiac result negative. No mass, tenderness, anal fissure, external hemorrhoid or internal hemorrhoid. Normal anal tone.  Musculoskeletal:        General: Normal range of motion.  Cervical back: Neck supple.     Right lower leg: No edema.     Left lower leg: No edema.  Lymphadenopathy:     Cervical: No cervical adenopathy.     Lower Body: No right inguinal adenopathy. No left inguinal adenopathy.  Skin:    General: Skin is warm and dry.  Neurological:     General: No focal deficit present.     Mental Status: He is alert. Mental status is at baseline.     Lab Results  Component Value Date   WBC 5.5 09/30/2019   HGB 14.6 09/30/2019   HCT 44.7 09/30/2019   PLT 251.0 09/30/2019   GLUCOSE 101 (H) 09/30/2019   CHOL 205 (H) 09/27/2022   TRIG 75.0 09/27/2022   HDL 57.60 09/27/2022   LDLDIRECT 114.5 10/27/2010   LDLCALC 132 (H) 09/27/2022   ALT 11 09/30/2019   AST 19 09/30/2019   NA 140 09/30/2019   K 4.1 09/30/2019   CL 104 09/30/2019    CREATININE 1.22 09/30/2019   BUN 12 09/30/2019   CO2 30 09/30/2019   TSH 1.43 09/30/2019   PSA 1.01 09/27/2022    MR CARDIAC MORPHOLOGY W WO CONTRAST  Result Date: 11/20/2019 CLINICAL DATA:  R/O Cardiac Sarcoid EXAM: CARDIAC MRI TECHNIQUE: The patient was scanned on a 1.5 Tesla Siemens magnet. A dedicated cardiac coil was used. Functional imaging was done using Fiesta sequences. 2,3, and 4 chamber views were done to assess for RWMA's. Modified Simpson's rule using a short axis stack was used to calculate an ejection fraction on a dedicated work Conservation officer, nature. The patient received 10 cc of Gadavist. After 10 minutes inversion recovery sequences were used to assess for infiltration and scar tissue. CONTRAST:  [id number] FINDINGS: Normal cardiac chamber sizes. No ASD/VSD. Some thinning of the membranous ventricular septum without shunt. Normal aortic root 2.6 cm Normal main PA 2.2 cm and RVOT Normal tri leaflet AV. Normal TV with mild TR. Mild MV thickening without significant MR. No ASD/PFO. No pericardiac effusion. Normal RV size and function Normal LV size and function Quantitative EF 65% (EDV 113 cc ESV 40 cc SV 73 cc) Delayed gadolinium images with no hyper-enhancement or evidence of sarcoid involvement of the heart IMPRESSION: 1. Normal cardiac MRI 2.  No evidence of cardiac sarcoid 3.  Normal LV size and function EF 65% Jenkins Rouge Electronically Signed   By: Jenkins Rouge M.D.   On: 11/20/2019 11:33    Assessment & Plan:  Need for hepatitis C screening test -     Hepatitis C antibody; Future  Routine general medical examination at a health care facility- Exam completed, labs reviewed, vaccines reviewed and updated, cancer screenings addressed, patient education was given. -     Lipid panel; Future -     PSA; Future  Screen for colon cancer -     Cologuard  Carpal tunnel syndrome on right -     Ambulatory referral to Occupational Therapy  Other orders -     Tdap vaccine  greater than or equal to 7yo IM -     Flu Vaccine QUAD 22mo+IM (Fluarix, Fluzone & Alfiuria Quad PF)     Follow-up: Return in about 1 year (around 09/27/2023).  Scarlette Calico, MD

## 2022-09-28 LAB — HEPATITIS C ANTIBODY: Hepatitis C Ab: NONREACTIVE

## 2022-09-29 ENCOUNTER — Telehealth: Payer: Self-pay | Admitting: Internal Medicine

## 2022-09-29 DIAGNOSIS — G5601 Carpal tunnel syndrome, right upper limb: Secondary | ICD-10-CM | POA: Insufficient documentation

## 2022-09-29 NOTE — Telephone Encounter (Signed)
Pt called asking if Dr. Ronnald Ramp can refer him back to physical therapy. Please call pt with update.

## 2022-09-29 NOTE — Telephone Encounter (Signed)
Is this for his knees?

## 2022-09-29 NOTE — Telephone Encounter (Signed)
Pt stated that is for numbness on his right hand around his pinky and thumb. He was there previously for this issue.

## 2022-10-03 NOTE — Therapy (Signed)
OUTPATIENT OCCUPATIONAL THERAPY ORTHO EVALUATION  Patient Name: Ryan Horton MRN: [id number] DOB:03-Jan-1976, 47 y.o., male Today's Date: 10/04/2022  PCP: Janith Lima, MD REFERRING PROVIDER:  Janith Lima, MD    END OF SESSION:  OT End of Session - 10/04/22 0836     Visit Number 1    Number of Visits 8    Date for OT Re-Evaluation 11/18/22    Authorization Type [employer]    OT Start Time 0837    OT Stop Time 0934    OT Time Calculation (min) 57 min    Activity Tolerance No increased pain;Patient tolerated treatment well;Patient limited by pain    Behavior During Therapy [facility] for tasks assessed/performed             History reviewed. No pertinent past medical history. Past Surgical History:  Procedure Laterality Date   KNEE ARTHROSCOPY  2011   right   VASECTOMY  06/12/2009   Patient Active Problem List   Diagnosis Date Noted   Carpal tunnel syndrome on right 09/29/2022   Need for hepatitis C screening test 09/27/2022   Screen for colon cancer 09/27/2022   Abnormal electrocardiogram (ECG) (EKG) 09/30/2019   Vitamin D deficiency 09/30/2019   Eczema 06/22/2017   Essential hypertension 05/11/2017   OSA (obstructive sleep apnea) 04/23/2014   Tinea versicolor 01/31/2013   Routine general medical examination at a health care facility 10/27/2010   Sarcoidosis 10/07/2010   OSGOOD [place] DISEASE 08/31/2006    ONSET DATE: 2-3 months worsening   REFERRING DIAG: G56.01 (ICD-10-CM) - Carpal tunnel syndrome on right   THERAPY DIAG:  Muscle weakness (generalized)  Paresthesia of skin  Pain in right elbow  Rationale for Evaluation and Treatment: Rehabilitation  SUBJECTIVE:   SUBJECTIVE STATEMENT: He is the Engineer, [employer] of [employer] for [employer], who states that 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  He also states "sleeping wrong" at night and getting  paresthesia at times as well early in the morning.  This has been going on for a few months now and is negatively impacting his job, his social life, his function.    PERTINENT HISTORY: Per MD notes: "Numbness right hand/pinky/thumb"   PRECAUTIONS: None; WEIGHT BEARING RESTRICTIONS: No  PAIN:  Are you having pain? Yes: NPRS scale: "burning" 5/10 Pain location: forearm/elbow Rt side Pain description: burning Aggravating factors: bending elbow, typing, lifting, etc. Relieving factors: resting, keeping arm straight  FALLS: Has patient fallen in last 6 months? No  PLOF: Independent  PATIENT GOALS: to have less pain/numbness in Rt arm   OBJECTIVE: (All objective assessments below are from initial evaluation on: 10/04/22 unless otherwise specified.)   HAND DOMINANCE: Right   ADLs: Overall ADLs: States decreased ability to grab, hold household objects, pain and difficulty to open containers, etc.   FUNCTIONAL OUTCOME MEASURES: Eval: Quck DASH 20% impairment today  (Higher % Score  =  More Impairment)     UPPER EXTREMITY ROM     Shoulder to Wrist AROM Right eval  Shoulder flexion 157  Shoulder abduction 157  Shoulder extension 77  Shoulder internal rotation 49  Shoulder external rotation 82  Elbow flexion 145 (150 Lt)   Elbow extension (-7)   Forearm supination 69  Forearm pronation  86  Wrist flexion 73 (67 Lt)   Wrist extension 56 (60 Lt)   (Blank rows =  not tested)   Hand AROM Right eval  Full Fist Ability (or Gap to Distal Palmar Crease) full  Thumb Opposition  (Kapandji Scale)  10  (Blank rows = not tested)   UPPER EXTREMITY MMT:    Eval: With just reaching overhead and bending and flexing the elbow he showed increase in paresthesia and pain symptoms, so strength was not "pushed."  This will be further assessed TBD, PRN   MMT Right TBD  Shoulder flexion   Shoulder abduction   Shoulder adduction   Shoulder extension   Shoulder internal rotation   Shoulder  external rotation   Middle trapezius   Lower trapezius   Elbow flexion   Elbow extension   Forearm supination   Forearm pronation   Wrist flexion   Wrist extension   Wrist ulnar deviation   Wrist radial deviation   (Blank rows = not tested)  HAND FUNCTION: Eval: Observed weakness in affected hand.  Grip strength Right: 99 lbs, Left: 106 lbs   COORDINATION: Eval: No significant observed coordination impairments with affected hand.  SENSATION: Eval:  Light touch diminished in Rt hand ulnar nerve distribution- 64mm S2-PT Discrimation in Ulnar nerve vs 76mm in Median nerve of Rt hand  (Lt hand had 45mm in both)   EDEMA:   Eval: none  COGNITION: Eval: Overall cognitive status: WFL for evaluation today  OBSERVATIONS:   Eval: 3 sec. positive elbow flexion test on the right, negative on the left.  Positive Tinel's at the cubital tunnel on the right, negative on the left.  Upper limb tension testing for the ulnar nerve was performed and showed exacerbation with neck and shoulder motion as well as elbow.  He presents like cubital tunnel syndrome double crush with tightness and impingement also at the Rt neck and shoulder.   TODAY'S TREATMENT:  Post-evaluation treatment: OT initially educates on nerve impingement and neuromuscular reeducation.  Entrapment sites at the neck, shoulder, elbow were discussed and he was told to avoid applying pressure to these areas or resting with a bent posture at the elbow shoulder or neck if able.  He was recommended to get a night elbow extension brace and given examples.  Next he was educated on nerve gliding and performs well with moderate increase in paresthesia today after performing (he is quite sensitive right now).  He was also given other stretches for the shoulder to the wrist as below and they were quickly reviewed with him.  He states understanding but they will need reviewed in upcoming sessions.  Exercises - Ulnar Nerve Flossing  - 3-4 x daily -  1-2 sets - 5-10 reps - Ulnar Nerve Glide- Full Arm  - 4-6 x daily - 1 sets - 10-15 reps - Seated Scapular Retraction  - 4 x daily - 5-10 reps - Standing Shoulder Internal Rotation Stretch Behind Back  - 2-3 x daily - 3-5 reps - 15 sec hold - Sleeper Stretch  - 3-4 x daily - 3-5 reps - 15 hold - Seated Shoulder External Rotation PROM on Table  - 3-4 x daily - 3-5 reps - 15 sec hold - Forearm Supination Stretch  - 3-4 x daily - 3-5 reps - 15 sec hold - Wrist Prayer Stretch  - 4 x daily - 3-5 reps - 15 sec hold   PATIENT EDUCATION: Education details: See tx section above for details  Person educated: Patient Education method: Verbal Instruction, Teach back, Handouts  Education comprehension: States and demonstrates understanding, Additional Education required  HOME EXERCISE PROGRAM: Access Code: [email or web address] URL: [email or web address] Date: 10/04/2022 Prepared by: Benito Mccreedy   GOALS: Goals reviewed with patient? Yes   SHORT TERM GOALS: (STG required if POC>30 days) Target Date: 10/21/22  Pt will obtain protective, custom orthotic. Goal status: TBD/PRN  2.  Pt will demo/state understanding of initial HEP to improve pain levels and prerequisite motion. Goal status: INITIAL   LONG TERM GOALS: Target Date: 11/18/22  Pt will improve functional ability by decreased impairment per Quick DASH assessment from 20% to 10% or better, for better quality of life. Goal status: INITIAL  2.  Pt will improve A/ROM in Rt shoulder IR from 49* to at least 63*, to have functional motion for tasks like reach and grasp.  Goal status: INITIAL  4.  Pt will improve strength in Rt elbow flex/ext from painful, not tolerated to at least 4+/5 MMT to have increased functional ability to carry out selfcare and higher-level homecare tasks with no difficulty. Goal status: INITIAL  5.  Pt will decrease nerve pain at rest from 5/10 to 1/10 or better to have better sleep and occupational  participation in daily roles. Goal status: INITIAL   ASSESSMENT:  CLINICAL IMPRESSION: Patient is a 47 y.o. male who was seen today for occupational therapy evaluation for right elbow and arm nerve pain (cubital tunnel syndrome exacerbated at the neck and shoulder), and decrease functional ability at home and work.  He will benefit from outpatient occupational therapy to increase quality of life and decrease symptoms.Marland Kitchen   PERFORMANCE DEFICITS: in functional skills including ADLs, IADLs, sensation, ROM, strength, pain, fascial restrictions, muscle spasms, flexibility, body mechanics, endurance, decreased knowledge of precautions, and UE functional use, cognitive skills including problem solving and safety awareness, and psychosocial skills including coping strategies, environmental adaptation, and habits.   IMPAIRMENTS: are limiting patient from ADLs, IADLs, rest and sleep, work, leisure, and social participation.   COMORBIDITIES: may have co-morbidities  that affects occupational performance. Patient will benefit from skilled OT to address above impairments and improve overall function.  MODIFICATION OR ASSISTANCE TO COMPLETE EVALUATION: No modification of tasks or assist necessary to complete an evaluation.  OT OCCUPATIONAL PROFILE AND HISTORY: Problem focused assessment: Including review of records relating to presenting problem.  CLINICAL DECISION MAKING: LOW - limited treatment options, no task modification necessary  REHAB POTENTIAL: Excellent  EVALUATION COMPLEXITY: Low      PLAN:  OT FREQUENCY: 1-2x/week (starting at 1x week, and increasing only if symptoms worsen or fail to resolve at this frequency)   OT DURATION: 6 weeks (through 11/18/22 as needed)   PLANNED INTERVENTIONS: self care/ADL training, therapeutic exercise, therapeutic activity, neuromuscular re-education, manual therapy, splinting, compression bandaging, moist heat, cryotherapy, contrast bath, patient/family  education, coping strategies training, and Dry needling  RECOMMENDED OTHER SERVICES: none now   CONSULTED AND AGREED WITH PLAN OF CARE: Patient  PLAN FOR NEXT SESSION:  Review initial, comprehensive home exercise program and recommendations to avoid nerve impingement in the day and night.   Benito Mccreedy, OTR/L, CHT 10/04/2022, 6:12 PM

## 2022-10-04 ENCOUNTER — Other Ambulatory Visit: Payer: Self-pay

## 2022-10-04 ENCOUNTER — Ambulatory Visit: Payer: 59 | Admitting: Rehabilitative and Restorative Service Providers"

## 2022-10-04 ENCOUNTER — Encounter: Payer: Self-pay | Admitting: Rehabilitative and Restorative Service Providers"

## 2022-10-04 DIAGNOSIS — M25521 Pain in right elbow: Secondary | ICD-10-CM

## 2022-10-04 DIAGNOSIS — R202 Paresthesia of skin: Secondary | ICD-10-CM | POA: Diagnosis not present

## 2022-10-04 DIAGNOSIS — M6281 Muscle weakness (generalized): Secondary | ICD-10-CM | POA: Diagnosis not present

## 2022-10-06 ENCOUNTER — Ambulatory Visit: Payer: 59 | Admitting: Physician Assistant

## 2022-10-10 NOTE — Therapy (Signed)
OUTPATIENT OCCUPATIONAL THERAPY TREATMENT NOTE  Patient Name: Ryan Horton MRN: [id number] DOB:April 20, 1976, 47 y.o., male Today's Date: 10/11/2022  PCP: Etta Grandchild, MD REFERRING PROVIDER:  Etta Grandchild, MD    END OF SESSION:  OT End of Session - 10/11/22 0802     Visit Number 2    Number of Visits 8    Date for OT Re-Evaluation 11/18/22    Authorization Type [employer]    OT Start Time 0802    OT Stop Time 0847    OT Time Calculation (min) 45 min    Activity Tolerance Patient tolerated treatment well;Patient limited by pain    Behavior During Therapy [facility] for tasks assessed/performed             History reviewed. No pertinent past medical history. Past Surgical History:  Procedure Laterality Date   KNEE ARTHROSCOPY  2011   right   VASECTOMY  06/12/2009   Patient Active Problem List   Diagnosis Date Noted   Carpal tunnel syndrome on right 09/29/2022   Need for hepatitis C screening test 09/27/2022   Screen for colon cancer 09/27/2022   Abnormal electrocardiogram (ECG) (EKG) 09/30/2019   Vitamin D deficiency 09/30/2019   Eczema 06/22/2017   Essential hypertension 05/11/2017   OSA (obstructive sleep apnea) 04/23/2014   Tinea versicolor 01/31/2013   Routine general medical examination at a health care facility 10/27/2010   Sarcoidosis 10/07/2010   OSGOOD SCHLATTERS DISEASE 08/31/2006    ONSET DATE: 2-3 months worsening   REFERRING DIAG: G56.01 (ICD-10-CM) - Carpal tunnel syndrome on right   THERAPY DIAG:  Muscle weakness (generalized)  Paresthesia of skin  Pain in right elbow  Rationale for Evaluation and Treatment: Rehabilitation  PERTINENT HISTORY: Per MD notes: "Numbness right hand/pinky/thumb"   PRECAUTIONS: None; WEIGHT BEARING RESTRICTIONS: No   SUBJECTIVE:   SUBJECTIVE STATEMENT: He states he was feeling better the first night, then things seemed to get worse.  He is noticing more paresthesia when driving and typing and he states that  perhaps wearing his bulletproof vest is putting a pressure on his shoulder.   PAIN:  Are you having pain?  Yes: NPRS scale: "burning" 4/10 at rest now, up to 8/10 at worst Pain location: forearm/elbow Rt side Pain description: burning Aggravating factors: bending elbow, typing, lifting, etc. Relieving factors: resting, keeping arm straight   PATIENT GOALS: to have less pain/numbness in Rt arm   OBJECTIVE: (All objective assessments below are from initial evaluation on: 10/04/22 unless otherwise specified.)   HAND DOMINANCE: Right   ADLs: Overall ADLs: States decreased ability to grab, hold household objects, pain and difficulty to open containers, etc.   FUNCTIONAL OUTCOME MEASURES: Eval: Quck DASH 20% impairment today  (Higher % Score  =  More Impairment)     UPPER EXTREMITY ROM     Shoulder to Wrist AROM Right eval  Shoulder flexion 157  Shoulder abduction 157  Shoulder extension 77  Shoulder internal rotation 49  Shoulder external rotation 82  Elbow flexion 145 (150 Lt)   Elbow extension (-7)   Forearm supination 69  Forearm pronation  86  Wrist flexion 73 (67 Lt)   Wrist extension 56 (60 Lt)   (Blank rows = not tested)   Hand AROM Right eval  Full Fist Ability (or Gap to Distal Palmar Crease) full  Thumb Opposition  (Kapandji Scale)  10  (Blank rows = not tested)   UPPER EXTREMITY MMT:     MMT Right TBD  Shoulder flexion   Shoulder abduction   Shoulder adduction   Shoulder extension   Shoulder internal rotation   Shoulder external rotation   Middle trapezius   Lower trapezius   Elbow flexion   Elbow extension   Forearm supination   Forearm pronation   Wrist flexion   Wrist extension   Wrist ulnar deviation   Wrist radial deviation   (Blank rows = not tested)  HAND FUNCTION: Eval: Observed weakness in affected hand.  Grip strength Right: 99 lbs, Left: 106 lbs   COORDINATION: Eval: No significant observed coordination impairments with  affected hand.  SENSATION: Eval:  Light touch diminished in Rt hand ulnar nerve distribution- 60mm S2-PT Discrimation in Ulnar nerve vs 66mm in Median nerve of Rt hand  (Lt hand had 30mm in both)   OBSERVATIONS:   Eval: 3 sec. positive elbow flexion test on the right, negative on the left.  Positive Tinel's at the cubital tunnel on the right, negative on the left.  Upper limb tension testing for the ulnar nerve was performed and showed exacerbation with neck and shoulder motion as well as elbow.  He presents like cubital tunnel syndrome double crush with tightness and impingement also at the Rt neck and shoulder.   TODAY'S TREATMENT:  10/11/22: OT does agree that if it is safe, he should not wear the bulletproof vest while typing and sitting at his desk at work.  OT also reviews avoiding pressure on the medial elbow, prolonged postures and repetition as able.  He states understanding and then his exercises were reviewed in depth with him performing back each one 3 to 5 x 4 repetitions listed below.  OT has to adapts ulnar nerve glides, as he is is feeling pain and feeling too much pressure through his nerve, so OT adjusts to relieve some tension and he states these are much better now.  Additionally to the stretches listed below, OT adds the chest stretch in the doorway with elbows bent to 90 degrees and abducted at 90 degrees.  He tolerates this well and states feeling a bit stretch.  At the end of the session OT has some time to perform manual therapy IASTM along the distal bicep and medial humerus down through the medial elbow and forearm.  He states this relieves some of his tingling when it is finished.  He leaves and no significant increase of pain and is asked to stay consistent, not because himself too much pain, continue to wear the elbow sleeve at night but looser and avoid direct pressure on the elbow.  He states understanding but he is somewhat nervous that he is "not getting better  yet."  Exercises - Ulnar Nerve Flossing  - 3-4 x daily - 1-2 sets - 5-10 reps - Ulnar Nerve Glide- Full Arm  - 4-6 x daily - 1 sets - 10-15 reps - Seated Scapular Retraction  - 4 x daily - 5-10 reps - Standing Shoulder Internal Rotation Stretch Behind Back  - 2-3 x daily - 3-5 reps - 15 sec hold - Sleeper Stretch  - 3-4 x daily - 3-5 reps - 15 hold - Seated Shoulder External Rotation PROM on Table  - 3-4 x daily - 3-5 reps - 15 sec hold - Doorway Pec Stretch at 90 Degrees Abduction  - 3 x daily - 7 x weekly - 1 sets - 3 reps - 30 seconds  hold - Forearm Supination Stretch  - 3-4 x daily - 3-5 reps - 15  sec hold - Wrist Prayer Stretch  - 4 x daily - 3-5 reps - 15 sec hold   PATIENT EDUCATION: Education details: See tx section above for details  Person educated: Patient Education method: Verbal Instruction, Teach back, Handouts  Education comprehension: States and demonstrates understanding, Additional Education required    HOME EXERCISE PROGRAM: Access Code: [email or web address] URL: [email or web address] Date: 10/04/2022 Prepared by: Fannie KneeNathanael Tajh Livsey   GOALS: Goals reviewed with patient? Yes   SHORT TERM GOALS: (STG required if POC>30 days) Target Date: 10/21/22  Pt will obtain protective, custom orthotic. Goal status: 10/11/22, NA- has night prefab ext brace now   2.  Pt will demo/state understanding of initial HEP to improve pain levels and prerequisite motion. Goal status: 10/11/22: MET   LONG TERM GOALS: Target Date: 11/18/22  Pt will improve functional ability by decreased impairment per Quick DASH assessment from 20% to 10% or better, for better quality of life. Goal status: INITIAL  2.  Pt will improve A/ROM in Rt shoulder IR from 49* to at least 63*, to have functional motion for tasks like reach and grasp.  Goal status: INITIAL  4.  Pt will improve strength in Rt elbow flex/ext from painful, not tolerated to at least 4+/5 MMT to have increased functional ability  to carry out selfcare and higher-level homecare tasks with no difficulty. Goal status: INITIAL  5.  Pt will decrease nerve pain at rest from 5/10 to 1/10 or better to have better sleep and occupational participation in daily roles. Goal status: INITIAL   ASSESSMENT:  CLINICAL IMPRESSION: 10/11/22: OT believes that he has increased awareness of his issue now which is somewhat of a "mental game."  OT reminds him that he is not expected to really see consistent relief for at least 2-3 more weeks if he is doing his exercises nonpainfully, avoiding exacerbations.  Continue plan of care   PLAN:  OT FREQUENCY: 1-2x/week (starting at 1x week, and increasing only if symptoms worsen or fail to resolve at this frequency)   OT DURATION: 6 weeks (through 11/18/22 as needed)   PLANNED INTERVENTIONS: self care/ADL training, therapeutic exercise, therapeutic activity, neuromuscular re-education, manual therapy, splinting, compression bandaging, moist heat, cryotherapy, contrast bath, patient/family education, coping strategies training, and Dry needling  CONSULTED AND AGREED WITH PLAN OF CARE: Patient  PLAN FOR NEXT SESSION:  Check on his HEP again as needed ensuring that he is not pushing too hard on his nerves, perform manual therapy from the chest down the humerus to the elbow and forearm as needed to help alleviate nerve impingement and pain.  Advance to light strengthening when and consider contract/relax techniques as well   Fannie KneeNathanael Hibba Schram, OTR/L, CHT 10/11/2022, 12:27 PM

## 2022-10-11 ENCOUNTER — Ambulatory Visit: Payer: 59 | Admitting: Rehabilitative and Restorative Service Providers"

## 2022-10-11 ENCOUNTER — Encounter: Payer: Self-pay | Admitting: Rehabilitative and Restorative Service Providers"

## 2022-10-11 DIAGNOSIS — R202 Paresthesia of skin: Secondary | ICD-10-CM | POA: Diagnosis not present

## 2022-10-11 DIAGNOSIS — M6281 Muscle weakness (generalized): Secondary | ICD-10-CM

## 2022-10-11 DIAGNOSIS — M25521 Pain in right elbow: Secondary | ICD-10-CM

## 2022-10-14 NOTE — Therapy (Signed)
OUTPATIENT OCCUPATIONAL THERAPY TREATMENT NOTE  Patient Name: Ryan Horton MRN: [id number] DOB:11-Aug-1975, 47 y.o., male Today's Date: 10/18/2022  PCP: Etta Grandchild, MD REFERRING PROVIDER:  Etta Grandchild, MD    END OF SESSION:  OT End of Session - 10/18/22 0927     Visit Number 3    Number of Visits 8    Date for OT Re-Evaluation 11/18/22    Authorization Type [employer]    OT Start Time [phone]    OT Stop Time 1007    OT Time Calculation (min) 39 min    Activity Tolerance Patient tolerated treatment well;Patient limited by pain    Behavior During Therapy [facility] for tasks assessed/performed             History reviewed. No pertinent past medical history. Past Surgical History:  Procedure Laterality Date   KNEE ARTHROSCOPY  2011   right   VASECTOMY  06/12/2009   Patient Active Problem List   Diagnosis Date Noted   Carpal tunnel syndrome on right 09/29/2022   Need for hepatitis C screening test 09/27/2022   Screen for colon cancer 09/27/2022   Abnormal electrocardiogram (ECG) (EKG) 09/30/2019   Vitamin D deficiency 09/30/2019   Eczema 06/22/2017   Essential hypertension 05/11/2017   OSA (obstructive sleep apnea) 04/23/2014   Tinea versicolor 01/31/2013   Routine general medical examination at a health care facility 10/27/2010   Sarcoidosis 10/07/2010   OSGOOD SCHLATTERS DISEASE 08/31/2006    ONSET DATE: 2-3 months worsening   REFERRING DIAG: G56.01 (ICD-10-CM) - Carpal tunnel syndrome on right   THERAPY DIAG:  Muscle weakness (generalized)  Paresthesia of skin  Pain in right elbow  Rationale for Evaluation and Treatment: Rehabilitation  PERTINENT HISTORY: Per MD notes: "Numbness right hand/pinky/thumb"   PRECAUTIONS: None; WEIGHT BEARING RESTRICTIONS: No   SUBJECTIVE:   SUBJECTIVE STATEMENT: He states feeling more consistent pain now ("worse") but also that his position matters greatly with his pain, fluctuates quickly, but at night is no  significant problem right now.    PAIN:  Are you having pain?  Yes: NPRS scale: "burning" quickly changes between 3-8/10 at rest now Pain location: forearm/elbow Rt side Pain description: burning Aggravating factors: bending elbow, typing, lifting, etc. Relieving factors: resting, keeping arm straight   PATIENT GOALS: to have less pain/numbness in Rt arm   OBJECTIVE: (All objective assessments below are from initial evaluation on: 10/04/22 unless otherwise specified.)   HAND DOMINANCE: Right   ADLs: Overall ADLs: States decreased ability to grab, hold household objects, pain and difficulty to open containers, etc.   FUNCTIONAL OUTCOME MEASURES: Eval: Quck DASH 20% impairment today  (Higher % Score  =  More Impairment)     UPPER EXTREMITY ROM     Shoulder to Wrist AROM Right eval Rt 10/18/22  Shoulder flexion 157   Shoulder abduction 157   Shoulder extension 77   Shoulder internal rotation 49 61  Shoulder external rotation 82   Elbow flexion 145 (150 Lt)  143  Elbow extension (-7)    Forearm supination 69 81  Forearm pronation  86 85  Wrist flexion 73 (67 Lt)  82  Wrist extension 56 (60 Lt)  74  (Blank rows = not tested)   Hand AROM Right eval  Full Fist Ability (or Gap to Distal Palmar Crease) full  Thumb Opposition  (Kapandji Scale)  10  (Blank rows = not tested)   UPPER EXTREMITY MMT:     MMT Right TBD  Shoulder flexion   Shoulder abduction   Shoulder adduction   Shoulder extension   Shoulder internal rotation   Shoulder external rotation   Middle trapezius   Lower trapezius   Elbow flexion   Elbow extension   Forearm supination   Forearm pronation   Wrist flexion   Wrist extension   Wrist ulnar deviation   Wrist radial deviation   (Blank rows = not tested)  HAND FUNCTION: 10/18/22: Rt grip: 92#  Eval: Observed weakness in affected hand.  Grip strength Right: 99 lbs, Left: 106 lbs   SENSATION: 10/18/22: Rt hand ulnar nerve distribution-  5mm S2-PT Discrimation  Eval:  Light touch diminished in Rt hand ulnar nerve distribution- 7mm S2-PT Discrimation in Ulnar nerve vs 4mm in Median nerve of Rt hand  (Lt hand had 4mm in both)   OBSERVATIONS:   Eval: 3 sec. positive elbow flexion test on the right, negative on the left.  Positive Tinel's at the cubital tunnel on the right, negative on the left.  Upper limb tension testing for the ulnar nerve was performed and showed exacerbation with neck and shoulder motion as well as elbow.  He presents like cubital tunnel syndrome double crush with tightness and impingement also at the Rt neck and shoulder.   TODAY'S TREATMENT:  10/18/22: He performs active range of motion which shows promising improvements today and when OT checks his sensation it is also significantly better now in the right hand small finger despite his complaints of feeling "worse."  OT also helps him adjust his night brace removing the metal stays for comfort, and OT reminds him to avoid bent elbow postures as much as possible as he complains about grooming shaving and brushing his teeth all which require elbow flexion greater than 90 degrees.  His exercises are reviewed and OT upgrades his prayer stretch to an extended arm wrist extension stretch as well as adding contract relax technique to the pronator/biceps group to be done 3-5 times for 15-second holds to fatigue those muscle groups.  He states increased paresthesia while doing but decreased afterwards.  Lastly due to continued tightness and rigidity in flexor groups at the medial elbow, OT discusses manual therapy modality dry needling with him.  After hearing about the possible side effects (bleeding, bruising, soreness, mental fatigue) and benefits-he agrees to proceed.  OT uses a 0.30 x 50mm needle inserted only partly into common flexor group and flexor carpi ulnaris muscles to get 2-3 large muscles twitch responses, after which she has no bleeding and no significant  lingering pains.  He states feeling "different" but he is not sure if it is better or not.  He is able to perform bigger amplitude nerve glides afterward. Paresthesia does not increase or linger.  He leaves and no significant pain, paresthesia or complaints.   10/11/22: OT does agree that if it is safe, he should not wear the bulletproof vest while typing and sitting at his desk at work.  OT also reviews avoiding pressure on the medial elbow, prolonged postures and repetition as able.  He states understanding and then his exercises were reviewed in depth with him performing back each one 3 to 5 x 4 repetitions listed below.  OT has to adapts ulnar nerve glides, as he is is feeling pain and feeling too much pressure through his nerve, so OT adjusts to relieve some tension and he states these are much better now.  Additionally to the stretches listed below, OT adds the chest stretch  in the doorway with elbows bent to 90 degrees and abducted at 90 degrees.  He tolerates this well and states feeling a bit stretch.  At the end of the session OT has some time to perform manual therapy IASTM along the distal bicep and medial humerus down through the medial elbow and forearm.  He states this relieves some of his tingling when it is finished.  He leaves and no significant increase of pain and is asked to stay consistent, not because himself too much pain, continue to wear the elbow sleeve at night but looser and avoid direct pressure on the elbow.  He states understanding but he is somewhat nervous that he is "not getting better yet."  Exercises - Ulnar Nerve Flossing  - 3-4 x daily - 1-2 sets - 5-10 reps - Ulnar Nerve Glide- Full Arm  - 4-6 x daily - 1 sets - 10-15 reps - Seated Scapular Retraction  - 4 x daily - 5-10 reps - Standing Shoulder Internal Rotation Stretch Behind Back  - 2-3 x daily - 3-5 reps - 15 sec hold - Sleeper Stretch  - 3-4 x daily - 3-5 reps - 15 hold - Seated Shoulder External Rotation PROM on  Table  - 3-4 x daily - 3-5 reps - 15 sec hold - Doorway Pec Stretch at 90 Degrees Abduction  - 3 x daily - 7 x weekly - 1 sets - 3 reps - 30 seconds  hold - Forearm Supination Stretch  - 3-4 x daily - 3-5 reps - 15 sec hold - Wrist Prayer Stretch  - 4 x daily - 3-5 reps - 15 sec hold   PATIENT EDUCATION: Education details: See tx section above for details  Person educated: Patient Education method: Verbal Instruction, Teach back, Handouts  Education comprehension: States and demonstrates understanding, Additional Education required    HOME EXERCISE PROGRAM: Access Code: [email or web address] URL: [email or web address] Date: 10/04/2022 Prepared by: Fannie Knee   GOALS: Goals reviewed with patient? Yes   SHORT TERM GOALS: (STG required if POC>30 days) Target Date: 10/21/22  Pt will obtain protective, custom orthotic. Goal status: 10/11/22, NA- has night prefab ext brace now   2.  Pt will demo/state understanding of initial HEP to improve pain levels and prerequisite motion. Goal status: 10/11/22: MET   LONG TERM GOALS: Target Date: 11/18/22  Pt will improve functional ability by decreased impairment per Quick DASH assessment from 20% to 10% or better, for better quality of life. Goal status: INITIAL  2.  Pt will improve A/ROM in Rt shoulder IR from 49* to at least 63*, to have functional motion for tasks like reach and grasp.  Goal status: INITIAL  4.  Pt will improve strength in Rt elbow flex/ext from painful, not tolerated to at least 4+/5 MMT to have increased functional ability to carry out selfcare and higher-level homecare tasks with no difficulty. Goal status: INITIAL  5.  Pt will decrease nerve pain at rest from 5/10 to 1/10 or better to have better sleep and occupational participation in daily roles. Goal status: INITIAL   ASSESSMENT:  CLINICAL IMPRESSION: 10/18/22: Despite again stating being "worse" his sensation is better, his range of motion is better, he  tolerates nerve glides better.  Dry needling seemed effective today for muscle twitches and OT is excited to hear about how he feels in the upcoming week.  Continue plan of care  10/11/22: OT believes that he has increased awareness of his issue now which is somewhat of  a "mental game."  OT reminds him that he is not expected to really see consistent relief for at least 2-3 more weeks if he is doing his exercises nonpainfully, avoiding exacerbations.  Continue plan of care   PLAN:  OT FREQUENCY: 1-2x/week (starting at 1x week, and increasing only if symptoms worsen or fail to resolve at this frequency)   OT DURATION: 6 weeks (through 11/18/22 as needed)   PLANNED INTERVENTIONS: self care/ADL training, therapeutic exercise, therapeutic activity, neuromuscular re-education, manual therapy, splinting, compression bandaging, moist heat, cryotherapy, contrast bath, patient/family education, coping strategies training, and Dry needling  CONSULTED AND AGREED WITH PLAN OF CARE: Patient  PLAN FOR NEXT SESSION:  Review upgraded prior stretch and contract relax techniques also sleep and grooming, check on effects after dry needling and continue manual therapy modalities and other therapy as indicated.   Fannie Knee, OTR/L, CHT 10/18/2022, 10:13 AM

## 2022-10-18 ENCOUNTER — Encounter: Payer: Self-pay | Admitting: Rehabilitative and Restorative Service Providers"

## 2022-10-18 ENCOUNTER — Ambulatory Visit (INDEPENDENT_AMBULATORY_CARE_PROVIDER_SITE_OTHER): Payer: 59 | Admitting: Rehabilitative and Restorative Service Providers"

## 2022-10-18 DIAGNOSIS — R202 Paresthesia of skin: Secondary | ICD-10-CM | POA: Diagnosis not present

## 2022-10-18 DIAGNOSIS — M6281 Muscle weakness (generalized): Secondary | ICD-10-CM

## 2022-10-18 DIAGNOSIS — M25521 Pain in right elbow: Secondary | ICD-10-CM

## 2022-10-27 ENCOUNTER — Ambulatory Visit: Payer: Self-pay | Admitting: Nurse Practitioner

## 2022-10-27 NOTE — Therapy (Signed)
OUTPATIENT OCCUPATIONAL THERAPY TREATMENT NOTE  Patient Name: Ryan Horton MRN: [id number] DOB:[birth year], 47 y.o., male Today's Date: 10/28/2022  PCP: Etta Grandchild, MD REFERRING PROVIDER:  Etta Grandchild, MD    END OF SESSION:  OT End of Session - 10/28/22 0933     Visit Number 4    Number of Visits 8    Date for OT Re-Evaluation 11/18/22    Authorization Type [employer]    OT Start Time 0934    OT Stop Time 1014    OT Time Calculation (min) 40 min    Activity Tolerance Patient tolerated treatment well;Patient limited by pain    Behavior During Therapy [facility] for tasks assessed/performed              History reviewed. No pertinent past medical history. Past Surgical History:  Procedure Laterality Date   KNEE ARTHROSCOPY  2011   right   VASECTOMY  06/12/2009   Patient Active Problem List   Diagnosis Date Noted   Carpal tunnel syndrome on right 09/29/2022   Need for hepatitis C screening test 09/27/2022   Screen for colon cancer 09/27/2022   Abnormal electrocardiogram (ECG) (EKG) 09/30/2019   Vitamin D deficiency 09/30/2019   Eczema 06/22/2017   Essential hypertension 05/11/2017   OSA (obstructive sleep apnea) 04/23/2014   Tinea versicolor 01/31/2013   Routine general medical examination at a health care facility 10/27/2010   Sarcoidosis 10/07/2010   OSGOOD SCHLATTERS DISEASE 08/31/2006    ONSET DATE: 2-3 months worsening   REFERRING DIAG: G56.01 (ICD-10-CM) - Carpal tunnel syndrome on right   THERAPY DIAG:  Muscle weakness (generalized)  Paresthesia of skin  Pain in right elbow  Rationale for Evaluation and Treatment: Rehabilitation  PERTINENT HISTORY: Per MD notes: "Numbness right hand/pinky/thumb"   PRECAUTIONS: None; WEIGHT BEARING RESTRICTIONS: No   SUBJECTIVE:   SUBJECTIVE STATEMENT: He states dry needling really helped, but then the numbness tingle came back and he is "impatient" and not happy. He doe sgo on to states he is sleeping  better, etc.    PAIN:  Are you having pain?  Yes: NPRS scale: "burning" 2/10 at rest now Pain location: forearm/elbow Rt side Pain description: burning Aggravating factors: bending elbow, typing, lifting, etc. Relieving factors: resting, keeping arm straight   PATIENT GOALS: to have less pain/numbness in Rt arm   OBJECTIVE: (All objective assessments below are from initial evaluation on: 10/04/22 unless otherwise specified.)   HAND DOMINANCE: Right   ADLs: Overall ADLs: States decreased ability to grab, hold household objects, pain and difficulty to open containers, etc.   FUNCTIONAL OUTCOME MEASURES: Eval: Quck DASH 20% impairment today  (Higher % Score  =  More Impairment)     UPPER EXTREMITY ROM     Shoulder to Wrist AROM Right eval Rt 10/18/22  Shoulder flexion 157   Shoulder abduction 157   Shoulder extension 77   Shoulder internal rotation 49 61  Shoulder external rotation 82   Elbow flexion 145 (150 Lt)  143  Elbow extension (-7)    Forearm supination 69 81  Forearm pronation  86 85  Wrist flexion 73 (67 Lt)  82  Wrist extension 56 (60 Lt)  74  (Blank rows = not tested)   Hand AROM Right eval  Full Fist Ability (or Gap to Distal Palmar Crease) full  Thumb Opposition  (Kapandji Scale)  10  (Blank rows = not tested)   UPPER EXTREMITY MMT:     MMT Right TBD  Shoulder flexion   Shoulder abduction   Shoulder adduction   Shoulder extension   Shoulder internal rotation   Shoulder external rotation   Middle trapezius   Lower trapezius   Elbow flexion   Elbow extension   Forearm supination   Forearm pronation   Wrist flexion   Wrist extension   Wrist ulnar deviation   Wrist radial deviation   (Blank rows = not tested)  HAND FUNCTION: 10/18/22: Rt grip: 92#  Eval: Observed weakness in affected hand.  Grip strength Right: 99 lbs, Left: 106 lbs   SENSATION: 10/18/22: Rt hand ulnar nerve distribution- 5mm S2-PT Discrimation  Eval:  Light touch  diminished in Rt hand ulnar nerve distribution- 7mm S2-PT Discrimation in Ulnar nerve vs 4mm in Median nerve of Rt hand  (Lt hand had 4mm in both)   OBSERVATIONS:   Eval: 3 sec. positive elbow flexion test on the right, negative on the left.  Positive Tinel's at the cubital tunnel on the right, negative on the left.  Upper limb tension testing for the ulnar nerve was performed and showed exacerbation with neck and shoulder motion as well as elbow.  He presents like cubital tunnel syndrome double crush with tightness and impingement also at the Rt neck and shoulder.   TODAY'S TREATMENT:  10/28/22: OT refers him to Dr. Denyse Amass for possible steroid injection to medial lebow, as he is "impatient," but also encourages him that he "must" avoid compression and keep doing HEP. OT then does IASTM to biceps/FCU area, and then percussion gun. He is edu on eccentric bicep curls in supination and has some increase tingle, but when done (5 reps), he states "I can feel my small finger now. OT encourages him that he is getting "better" but needs to be consistent. He can f/u with Dr. Denyse Amass as desired and return here as well.    10/18/22: He performs active range of motion which shows promising improvements today and when OT checks his sensation it is also significantly better now in the right hand small finger despite his complaints of feeling "worse."  OT also helps him adjust his night brace removing the metal stays for comfort, and OT reminds him to avoid bent elbow postures as much as possible as he complains about grooming shaving and brushing his teeth all which require elbow flexion greater than 90 degrees.  His exercises are reviewed and OT upgrades his prayer stretch to an extended arm wrist extension stretch as well as adding contract relax technique to the pronator/biceps group to be done 3-5 times for 15-second holds to fatigue those muscle groups.  He states increased paresthesia while doing but decreased  afterwards.  Lastly due to continued tightness and rigidity in flexor groups at the medial elbow, OT discusses manual therapy modality dry needling with him.  After hearing about the possible side effects (bleeding, bruising, soreness, mental fatigue) and benefits-he agrees to proceed.  OT uses a 0.30 x 50mm needle inserted only partly into common flexor group and flexor carpi ulnaris muscles to get 2-3 large muscles twitch responses, after which she has no bleeding and no significant lingering pains.  He states feeling "different" but he is not sure if it is better or not.  He is able to perform bigger amplitude nerve glides afterward. Paresthesia does not increase or linger.  He leaves and no significant pain, paresthesia or complaints.   PATIENT EDUCATION: Education details: See tx section above for details  Person educated: Patient Education method: Verbal  Instruction, Teach back, Handouts  Education comprehension: States and demonstrates understanding, Additional Education required    HOME EXERCISE PROGRAM: Access Code: [email or web address] URL: [email or web address] Date: 10/04/2022 Prepared by: Fannie Knee   GOALS: Goals reviewed with patient? Yes   SHORT TERM GOALS: (STG required if POC>30 days) Target Date: 10/21/22  Pt will obtain protective, custom orthotic. Goal status: 10/11/22, NA- has night prefab ext brace now   2.  Pt will demo/state understanding of initial HEP to improve pain levels and prerequisite motion. Goal status: 10/11/22: MET   LONG TERM GOALS: Target Date: 11/18/22  Pt will improve functional ability by decreased impairment per Quick DASH assessment from 20% to 10% or better, for better quality of life. Goal status: INITIAL  2.  Pt will improve A/ROM in Rt shoulder IR from 49* to at least 63*, to have functional motion for tasks like reach and grasp.  Goal status: INITIAL  4.  Pt will improve strength in Rt elbow flex/ext from painful, not tolerated  to at least 4+/5 MMT to have increased functional ability to carry out selfcare and higher-level homecare tasks with no difficulty. Goal status: INITIAL  5.  Pt will decrease nerve pain at rest from 5/10 to 1/10 or better to have better sleep and occupational participation in daily roles. Goal status: INITIAL   ASSESSMENT:  CLINICAL IMPRESSION: 10/28/22: Doing better each session, and states less tingling and can "feel his finger" after today's session. However he still is largely pessimistic and is referred to Dr. Denyse Amass for possible tx including injection for CuTS.    PLAN:  OT FREQUENCY: 1-2x/week (starting at 1x week, and increasing only if symptoms worsen or fail to resolve at this frequency)   OT DURATION: 6 weeks (through 11/18/22 as needed)   PLANNED INTERVENTIONS: self care/ADL training, therapeutic exercise, therapeutic activity, neuromuscular re-education, manual therapy, splinting, compression bandaging, moist heat, cryotherapy, contrast bath, patient/family education, coping strategies training, and Dry needling  CONSULTED AND AGREED WITH PLAN OF CARE: Patient  PLAN FOR NEXT SESSION:  Continue on with manual and contract/relax for tight FCU, biceps, etc.    Fannie Knee, OTR/L, CHT 10/28/2022, 10:20 AM

## 2022-10-28 ENCOUNTER — Encounter: Payer: Self-pay | Admitting: Rehabilitative and Restorative Service Providers"

## 2022-10-28 ENCOUNTER — Ambulatory Visit: Payer: 59 | Admitting: Rehabilitative and Restorative Service Providers"

## 2022-10-28 DIAGNOSIS — M6281 Muscle weakness (generalized): Secondary | ICD-10-CM

## 2022-10-28 DIAGNOSIS — M25521 Pain in right elbow: Secondary | ICD-10-CM | POA: Diagnosis not present

## 2022-10-28 DIAGNOSIS — R202 Paresthesia of skin: Secondary | ICD-10-CM | POA: Diagnosis not present

## 2022-10-31 NOTE — Therapy (Signed)
OUTPATIENT OCCUPATIONAL THERAPY TREATMENT NOTE  Patient Name: Ryan Horton MRN: [id number] DOB:05-12-1976, 47 y.o., male Today's Date: 11/01/2022  PCP: Etta Grandchild, MD REFERRING PROVIDER:  Etta Grandchild, MD    END OF SESSION:  OT End of Session - 11/01/22 0840     Visit Number 5    Number of Visits 8    Date for OT Re-Evaluation 11/18/22    Authorization Type [employer]    OT Start Time 0840    OT Stop Time 0912    OT Time Calculation (min) 32 min    Activity Tolerance Patient tolerated treatment well;No increased pain    Behavior During Therapy [facility] for tasks assessed/performed              History reviewed. No pertinent past medical history. Past Surgical History:  Procedure Laterality Date   KNEE ARTHROSCOPY  2011   right   VASECTOMY  06/12/2009   Patient Active Problem List   Diagnosis Date Noted   Carpal tunnel syndrome on right 09/29/2022   Need for hepatitis C screening test 09/27/2022   Screen for colon cancer 09/27/2022   Abnormal electrocardiogram (ECG) (EKG) 09/30/2019   Vitamin D deficiency 09/30/2019   Eczema 06/22/2017   Essential hypertension 05/11/2017   OSA (obstructive sleep apnea) 04/23/2014   Tinea versicolor 01/31/2013   Routine general medical examination at a health care facility 10/27/2010   Sarcoidosis 10/07/2010   OSGOOD SCHLATTERS DISEASE 08/31/2006    ONSET DATE: 2-3 months worsening   REFERRING DIAG: G56.01 (ICD-10-CM) - Carpal tunnel syndrome on right   THERAPY DIAG:  Muscle weakness (generalized)  Paresthesia of skin  Pain in right elbow  Rationale for Evaluation and Treatment: Rehabilitation  PERTINENT HISTORY: Per MD notes: "Numbness right hand/pinky/thumb"   PRECAUTIONS: None; WEIGHT BEARING RESTRICTIONS: No   SUBJECTIVE:   SUBJECTIVE STATEMENT: He states doing better, sleeping well, tolerating pushups again, but also having some radial nerve symptoms to dorsal FA running to SF now.    PAIN:  Are you  having pain?   Yes: NPRS scale: "burning" 2/10 at rest now Pain location: forearm/elbow Rt side Pain description: burning Aggravating factors: bending elbow, typing, lifting, etc. Relieving factors: resting, keeping arm straight   PATIENT GOALS: to have less pain/numbness in Rt arm   OBJECTIVE: (All objective assessments below are from initial evaluation on: 10/04/22 unless otherwise specified.)   HAND DOMINANCE: Right   ADLs: Overall ADLs: States decreased ability to grab, hold household objects, pain and difficulty to open containers, etc.   FUNCTIONAL OUTCOME MEASURES: Eval: Quck DASH 20% impairment today  (Higher % Score  =  More Impairment)     UPPER EXTREMITY ROM     Shoulder to Wrist AROM Right eval Rt 10/18/22  Shoulder flexion 157   Shoulder abduction 157   Shoulder extension 77   Shoulder internal rotation 49 61  Shoulder external rotation 82   Elbow flexion 145 (150 Lt)  143  Elbow extension (-7)    Forearm supination 69 81  Forearm pronation  86 85  Wrist flexion 73 (67 Lt)  82  Wrist extension 56 (60 Lt)  74  (Blank rows = not tested)   Hand AROM Right eval  Full Fist Ability (or Gap to Distal Palmar Crease) full  Thumb Opposition  (Kapandji Scale)  10  (Blank rows = not tested)   UPPER EXTREMITY MMT:     MMT Right TBD  Shoulder flexion   Shoulder abduction  Shoulder adduction   Shoulder extension   Shoulder internal rotation   Shoulder external rotation   Middle trapezius   Lower trapezius   Elbow flexion   Elbow extension   Forearm supination   Forearm pronation   Wrist flexion   Wrist extension   Wrist ulnar deviation   Wrist radial deviation   (Blank rows = not tested)  HAND FUNCTION: 10/18/22: Rt grip: 92#  Eval: Observed weakness in affected hand.  Grip strength Right: 99 lbs, Left: 106 lbs   SENSATION: 10/18/22: Rt hand ulnar nerve distribution- 5mm S2-PT Discrimation  Eval:  Light touch diminished in Rt hand ulnar nerve  distribution- 7mm S2-PT Discrimation in Ulnar nerve vs 4mm in Median nerve of Rt hand  (Lt hand had 4mm in both)   OBSERVATIONS:   Eval: 3 sec. positive elbow flexion test on the right, negative on the left.  Positive Tinel's at the cubital tunnel on the right, negative on the left.  Upper limb tension testing for the ulnar nerve was performed and showed exacerbation with neck and shoulder motion as well as elbow.  He presents like cubital tunnel syndrome double crush with tightness and impingement also at the Rt neck and shoulder.   TODAY'S TREATMENT:  11/01/22: OT applies moist heat around his tricep and right elbow while explaining new stretches and nerve glides today.  OT then does IASTM manual therapy as well as percussion gun and myofascial release for the ulnar nerve by the elbow and in her biceps and also the radial nerve by the lateral epicondyle and triceps.  He then stands and states "I feel good.  I feel ready to fight."  Next he is educated on new radial nerve glide "see quick note," as well as "waiter carry" nerve glide which can affect all the nerves of the arm.  He performs a few of each feeling significant tension and is told to not push so hard.  Next he reviews the idea of eccentric bicep curls to help stretch the medial elbow as well as doing an pronated grip to help stretch the lateral elbow he performs these 5 reps each as well as shoulder pec stretch and wrist flexion and extension stretches.  He states understanding all and feeling better at the end.    He asks OT "what should I communicate to the doctor tomorrow?"  OT advises to tell him that you are feeling better, there is some radial and ulnar nerve irritation, and also to ask him if he feels injection or shockwave therapy could prove useful or potentially flare him back up.  He states understanding    PATIENT EDUCATION: Education details: See tx section above for details  Person educated: Patient Education method: Verbal  Instruction, Teach back, Handouts  Education comprehension: States and demonstrates understanding, Additional Education required    HOME EXERCISE PROGRAM: Access Code: [email or web address] URL: [email or web address] Date: 10/04/2022 Prepared by: Fannie Knee   GOALS: Goals reviewed with patient? Yes   SHORT TERM GOALS: (STG required if POC>30 days) Target Date: 10/21/22  Pt will obtain protective, custom orthotic. Goal status: 10/11/22, NA- has night prefab ext brace now   2.  Pt will demo/state understanding of initial HEP to improve pain levels and prerequisite motion. Goal status: 10/11/22: MET   LONG TERM GOALS: Target Date: 11/18/22  Pt will improve functional ability by decreased impairment per Quick DASH assessment from 20% to 10% or better, for better quality of life. Goal status: INITIAL  2.  Pt  will improve A/ROM in Rt shoulder IR from 49* to at least 63*, to have functional motion for tasks like reach and grasp.  Goal status: INITIAL  4.  Pt will improve strength in Rt elbow flex/ext from painful, not tolerated to at least 4+/5 MMT to have increased functional ability to carry out selfcare and higher-level homecare tasks with no difficulty. Goal status: INITIAL  5.  Pt will decrease nerve pain at rest from 5/10 to 1/10 or better to have better sleep and occupational participation in daily roles. Goal status: INITIAL   ASSESSMENT:  CLINICAL IMPRESSION: 11/01/22: He is finally started to do better and admit feeling better-now able to do push-ups now able to work out sleeping very well.  He did have some feeling of radial nerve involvement recently so that is being addressed now as well.  He will go to Dr. Denyse Amass tomorrow and was advised to ask whether injection or other modality or treatment could be useful to him versus simply continuing conservative therapy.   PLAN:  OT FREQUENCY: 1-2x/week (starting at 1x week, and increasing only if symptoms worsen or fail  to resolve at this frequency)   OT DURATION: 6 weeks (through 11/18/22 as needed)   PLANNED INTERVENTIONS: self care/ADL training, therapeutic exercise, therapeutic activity, neuromuscular re-education, manual therapy, splinting, compression bandaging, moist heat, cryotherapy, contrast bath, patient/family education, coping strategies training, and Dry needling  CONSULTED AND AGREED WITH PLAN OF CARE: Patient  PLAN FOR NEXT SESSION:   Continue plan of care reviewing new waiter carry an secret note nerve glides as well as stretches for chest tricep forearm.  As well as eccentric bicep curls pronated and supinated. Manual therapy has always been found helpful as well so continue that   [employer], OTR/L, CHT 11/01/2022, 9:23 AM

## 2022-11-01 ENCOUNTER — Encounter: Payer: Self-pay | Admitting: Rehabilitative and Restorative Service Providers"

## 2022-11-01 ENCOUNTER — Ambulatory Visit: Payer: 59 | Admitting: Rehabilitative and Restorative Service Providers"

## 2022-11-01 DIAGNOSIS — R202 Paresthesia of skin: Secondary | ICD-10-CM | POA: Diagnosis not present

## 2022-11-01 DIAGNOSIS — M6281 Muscle weakness (generalized): Secondary | ICD-10-CM | POA: Diagnosis not present

## 2022-11-01 DIAGNOSIS — M25521 Pain in right elbow: Secondary | ICD-10-CM | POA: Diagnosis not present

## 2022-11-01 NOTE — Progress Notes (Unsigned)
   Rubin Payor, PhD, LAT, ATC acting as a scribe for [name], MD.  Ryan Horton is a 47 y.o. male who presents to [facility] today for R arm pain and referral from OT for possible steroid injection. 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 Pt was last seen by Dr. Denyse Amass on 09/15/20 and he was prescribed prednisone, gabapentin, and referred to OT, completing 5 visits.   Today, pt reports ***  Dx imaging: 07/21/20 C-spine XR  Pertinent review of systems: ***  Relevant historical information: ***   Exam:  There were no vitals taken for this visit. General: Well Developed, well nourished, and in no acute distress.   MSK: ***    Lab and Radiology Results No results found for this or any previous visit (from the past 72 hour(s)). No results found.     Assessment and Plan: 47 y.o. male with ***   PDMP not reviewed this encounter. No orders of the defined types were placed in this encounter.  No orders of the defined types were placed in this encounter.    Discussed warning signs or symptoms. Please see discharge instructions. Patient expresses understanding.   ***

## 2022-11-02 ENCOUNTER — Ambulatory Visit: Payer: 59 | Admitting: Family Medicine

## 2022-11-02 ENCOUNTER — Other Ambulatory Visit: Payer: Self-pay

## 2022-11-02 ENCOUNTER — Encounter: Payer: Self-pay | Admitting: Family Medicine

## 2022-11-02 VITALS — BP 130/86 | HR 90 | Ht 70.0 in | Wt 167.0 lb

## 2022-11-02 DIAGNOSIS — G5621 Lesion of ulnar nerve, right upper limb: Secondary | ICD-10-CM

## 2022-11-02 DIAGNOSIS — M79601 Pain in right arm: Secondary | ICD-10-CM | POA: Diagnosis not present

## 2022-11-02 NOTE — Patient Instructions (Addendum)
Thank you for coming in today.   Call or go to the ER if you develop a large red swollen joint with extreme pain or oozing puss.    Keep going with OT.   Keep me updated.   If not better there is more to do.

## 2022-11-04 LAB — COLOGUARD: Cologuard: NEGATIVE

## 2022-11-05 LAB — COLOGUARD: COLOGUARD: NEGATIVE

## 2022-11-08 NOTE — Therapy (Signed)
OUTPATIENT OCCUPATIONAL THERAPY TREATMENT NOTE  Patient Name: Ryan Horton MRN: [id number] DOB:Feb 27, 1976, 47 y.o., male Today's Date: 11/10/2022  PCP: Etta Grandchild, MD REFERRING PROVIDER:  Etta Grandchild, MD    END OF SESSION:  OT End of Session - 11/10/22 0936     Visit Number 6    Number of Visits 8    Date for OT Re-Evaluation 11/18/22    Authorization Type U[employer]    OT Start Time 0936    OT Stop Time 1014    OT Time Calculation (min) 38 min    Activity Tolerance Patient tolerated treatment well;No increased pain;Patient limited by fatigue    Behavior During Therapy [facility] for tasks assessed/performed             History reviewed. No pertinent past medical history. Past Surgical History:  Procedure Laterality Date   KNEE ARTHROSCOPY  2011   right   VASECTOMY  06/12/2009   Patient Active Problem List   Diagnosis Date Noted   Carpal tunnel syndrome on right 09/29/2022   Need for hepatitis C screening test 09/27/2022   Screen for colon cancer 09/27/2022   Abnormal electrocardiogram (ECG) (EKG) 09/30/2019   Vitamin D deficiency 09/30/2019   Eczema 06/22/2017   Essential hypertension 05/11/2017   OSA (obstructive sleep apnea) 04/23/2014   Tinea versicolor 01/31/2013   Routine general medical examination at a health care facility 10/27/2010   Sarcoidosis 10/07/2010   OSGOOD SCHLATTERS DISEASE 08/31/2006    ONSET DATE: 2-3 months worsening   REFERRING DIAG: G56.01 (ICD-10-CM) - Carpal tunnel syndrome on right   THERAPY DIAG:  Muscle weakness (generalized)  Paresthesia of skin  Pain in right elbow  Rationale for Evaluation and Treatment: Rehabilitation  PERTINENT HISTORY: Per MD notes: "Numbness right hand/pinky/thumb"   PRECAUTIONS: None; WEIGHT BEARING RESTRICTIONS: No   SUBJECTIVE:   SUBJECTIVE STATEMENT: He states doing better since injection, pain and tingling is down, and he states understanding his symptoms better and knowing what he  needs to avoid better now.   PAIN:  Are you having pain?  Yes: NPRS scale: "burning" 0.5/10 at rest now Pain location: forearm/elbow Rt side Pain description: burning Aggravating factors: bending elbow, typing, lifting, etc. Relieving factors: resting, keeping arm straight   PATIENT GOALS: to have less pain/numbness in Rt arm   OBJECTIVE: (All objective assessments below are from initial evaluation on: 10/04/22 unless otherwise specified.)   HAND DOMINANCE: Right   ADLs: Overall ADLs: States decreased ability to grab, hold household objects, pain and difficulty to open containers, etc.   FUNCTIONAL OUTCOME MEASURES: Eval: Quck DASH 20% impairment today  (Higher % Score  =  More Impairment)     UPPER EXTREMITY ROM     Shoulder to Wrist AROM Right eval Rt 10/18/22  Shoulder flexion 157   Shoulder abduction 157   Shoulder extension 77   Shoulder internal rotation 49 61  Shoulder external rotation 82   Elbow flexion 145 (150 Lt)  143  Elbow extension (-7)    Forearm supination 69 81  Forearm pronation  86 85  Wrist flexion 73 (67 Lt)  82  Wrist extension 56 (60 Lt)  74  (Blank rows = not tested)   Hand AROM Right eval  Full Fist Ability (or Gap to Distal Palmar Crease) full  Thumb Opposition  (Kapandji Scale)  10  (Blank rows = not tested)   UPPER EXTREMITY MMT:     MMT Right 11/10/22  Shoulder flexion  Shoulder abduction   Shoulder adduction   Shoulder extension   Shoulder internal rotation   Shoulder external rotation   Middle trapezius   Lower trapezius   Elbow flexion 5/5  Elbow extension 5/5  Forearm supination 5/5  Forearm pronation 4+/5 tender  Wrist flexion 5/5  Wrist extension 5/5  Wrist ulnar deviation   Wrist radial deviation   (Blank rows = not tested)  HAND FUNCTION: 11/10/22: Rt grip: 97# now  10/18/22: Rt grip: 92#  Eval: Observed weakness in affected hand.  Grip strength Right: 99 lbs, Left: 106 lbs   SENSATION: 11/10/22: S2PD Rt  Ulnar nerve: 5mm (4mm in Lt ulnar nerve)   10/18/22: Rt hand ulnar nerve distribution- 5mm S2-PT Discrimation  Eval:  Light touch diminished in Rt hand ulnar nerve distribution- 7mm S2-PT Discrimation in Ulnar nerve vs 4mm in Median nerve of Rt hand  (Lt hand had 4mm in both)   OBSERVATIONS:   Eval: 3 sec. positive elbow flexion test on the right, negative on the left.  Positive Tinel's at the cubital tunnel on the right, negative on the left.  Upper limb tension testing for the ulnar nerve was performed and showed exacerbation with neck and shoulder motion as well as elbow.  He presents like cubital tunnel syndrome double crush with tightness and impingement also at the Rt neck and shoulder.   TODAY'S TREATMENT:  11/10/22: OT continues to review self-care/avoidance of nerve compression which she has a better handle on now.  OT reviews nerve glides with him as well as upgraded stretches for the wrist extension with bicep extension as well as upgraded PRE that was modified for different positions to avoid too much pressure on the pronator's were too much wrist extension causing additional nerve entrapment (modified PRE as listed below).  At the end of the session we also work on a very slow golf swing as this is a activity he would like to return to.  OT advises to do only very slow nonpainful motion as this can agitate the inner elbow where he has been having nerve entrapment.  He is swinging slowly several times and states that he would rather wait a few weeks to feel even better before attempting this activity.  Back rows: 55# x 5, some tingle, so reduced to 45# x 8 and reduced ROM  Tricep Press 45# x10  Modified chest press (limited wrist flexion) 35# x10  Biceps eccentric curls pronated and supinated with 35#   PATIENT EDUCATION: Education details: See tx section above for details  Person educated: Patient Education method: Engineer, structural, Teach back, Handouts  Education comprehension:  States and demonstrates understanding, Additional Education required    HOME EXERCISE PROGRAM: Access Code: [email or web address] URL: [email or web address] Date: 10/04/2022 Prepared by: Fannie Knee   GOALS: Goals reviewed with patient? Yes   SHORT TERM GOALS: (STG required if POC>30 days) Target Date: 10/21/22  Pt will obtain protective, custom orthotic. Goal status: 10/11/22, NA- has night prefab ext brace now   2.  Pt will demo/state understanding of initial HEP to improve pain levels and prerequisite motion. Goal status: 10/11/22: MET   LONG TERM GOALS: Target Date: 11/18/22  Pt will improve functional ability by decreased impairment per Quick DASH assessment from 20% to 10% or better, for better quality of life. Goal status: INITIAL  2.  Pt will improve A/ROM in Rt shoulder IR from 49* to at least 63*, to have functional motion for tasks like reach and grasp.  Goal status:  INITIAL  4.  Pt will improve strength in Rt elbow flex/ext from painful, not tolerated to at least 4+/5 MMT to have increased functional ability to carry out selfcare and higher-level homecare tasks with no difficulty. Goal status: INITIAL  5.  Pt will decrease nerve pain at rest from 5/10 to 1/10 or better to have better sleep and occupational participation in daily roles. Goal status: INITIAL   ASSESSMENT:  CLINICAL IMPRESSION: 11/10/22: As he is doing excellent after injection, his next visit which was scheduled for his last will likely be his last.  If he does not have exacerbation or significant issues we will do a progress note and likely discharge next session.    PLAN:  PLANNED INTERVENTIONS: self care/ADL training, therapeutic exercise, therapeutic activity, neuromuscular re-education, manual therapy, splinting, compression bandaging, moist heat, cryotherapy, contrast bath, patient/family education, coping strategies training, and Dry needling  CONSULTED AND AGREED WITH PLAN OF CARE:  Patient  PLAN FOR NEXT SESSION:   Check all goals for progress note and determine any more need for therapy.   Fannie Knee, OTR/L, CHT 11/10/2022, 2:26 PM

## 2022-11-10 ENCOUNTER — Ambulatory Visit: Payer: 59 | Admitting: Rehabilitative and Restorative Service Providers"

## 2022-11-10 ENCOUNTER — Encounter: Payer: Self-pay | Admitting: Rehabilitative and Restorative Service Providers"

## 2022-11-10 DIAGNOSIS — M25521 Pain in right elbow: Secondary | ICD-10-CM | POA: Diagnosis not present

## 2022-11-10 DIAGNOSIS — M6281 Muscle weakness (generalized): Secondary | ICD-10-CM

## 2022-11-10 DIAGNOSIS — R202 Paresthesia of skin: Secondary | ICD-10-CM

## 2022-11-16 NOTE — Therapy (Signed)
OUTPATIENT OCCUPATIONAL THERAPY TREATMENT & DISCHARGE *** NOTE  Patient Name: Ryan Horton MRN: [id number] DOB:April 24, 1976, 47 y.o., male Today's Date: 11/10/2022  PCP: Etta Grandchild, MD REFERRING PROVIDER:  Etta Grandchild, MD    END OF SESSION:  OT End of Session - 11/10/22 0936     Visit Number 6    Number of Visits 8    Date for OT Re-Evaluation 11/18/22    Authorization Type U[employer]    OT Start Time 0936    OT Stop Time 1014    OT Time Calculation (min) 38 min    Activity Tolerance Patient tolerated treatment well;No increased pain;Patient limited by fatigue    Behavior During Therapy [facility] for tasks assessed/performed             History reviewed. No pertinent past medical history. Past Surgical History:  Procedure Laterality Date   KNEE ARTHROSCOPY  2011   right   VASECTOMY  06/12/2009   Patient Active Problem List   Diagnosis Date Noted   Carpal tunnel syndrome on right 09/29/2022   Need for hepatitis C screening test 09/27/2022   Screen for colon cancer 09/27/2022   Abnormal electrocardiogram (ECG) (EKG) 09/30/2019   Vitamin D deficiency 09/30/2019   Eczema 06/22/2017   Essential hypertension 05/11/2017   OSA (obstructive sleep apnea) 04/23/2014   Tinea versicolor 01/31/2013   Routine general medical examination at a health care facility 10/27/2010   Sarcoidosis 10/07/2010   OSGOOD SCHLATTERS DISEASE 08/31/2006    ONSET DATE: 2-3 months worsening   REFERRING DIAG: G56.01 (ICD-10-CM) - Carpal tunnel syndrome on right   THERAPY DIAG:  Muscle weakness (generalized)  Paresthesia of skin  Pain in right elbow  Rationale for Evaluation and Treatment: Rehabilitation  PERTINENT HISTORY: Per MD notes: "Numbness right hand/pinky/thumb"   PRECAUTIONS: None; WEIGHT BEARING RESTRICTIONS: No   SUBJECTIVE:   SUBJECTIVE STATEMENT: He states ***   doing better since injection, pain and tingling is down, and he states understanding his symptoms  better and knowing what he needs to avoid better now.   PAIN:  Are you having pain? **  Yes: NPRS scale: "burning" 0.5/10 at rest now Pain location: forearm/elbow Rt side Pain description: burning Aggravating factors: bending elbow, typing, lifting, etc. Relieving factors: resting, keeping arm straight   PATIENT GOALS: to have less pain/numbness in Rt arm   OBJECTIVE: (All objective assessments below are from initial evaluation on: 10/04/22 unless otherwise specified.)   HAND DOMINANCE: Right   ADLs: Overall ADLs: *** States decreased ability to grab, hold household objects, pain and difficulty to open containers, etc.   FUNCTIONAL OUTCOME MEASURES: 11/17/22: Quick DASH *** % impairment today  Eval: Quck DASH 20% impairment today  (Higher % Score  =  More Impairment)     UPPER EXTREMITY ROM     Shoulder to Wrist AROM Right eval Rt 10/18/22 Rt 11/17/22  Shoulder flexion 157  ***  Shoulder abduction 157  ***  Shoulder extension 77  ***  Shoulder internal rotation 49 61 ***  Shoulder external rotation 82  ***  Elbow flexion 145 (150 Lt)  143 ***  Elbow extension (-7)   ***  Forearm supination 69 81 ***  Forearm pronation  86 85 ***  Wrist flexion 73 (67 Lt)  82 ***  Wrist extension 56 (60 Lt)  74 ***  (Blank rows = not tested)   Hand AROM Right eval  Full Fist Ability (or Gap to Distal Palmar Crease) full  Thumb Opposition  (Kapandji Scale)  10  (Blank rows = not tested)   UPPER EXTREMITY MMT:     MMT Right 11/10/22  Shoulder flexion   Shoulder abduction   Shoulder adduction   Shoulder extension   Shoulder internal rotation   Shoulder external rotation   Middle trapezius   Lower trapezius   Elbow flexion 5/5  Elbow extension 5/5  Forearm supination 5/5  Forearm pronation 4+/5 tender  Wrist flexion 5/5  Wrist extension 5/5  Wrist ulnar deviation   Wrist radial deviation   (Blank rows = not tested)  HAND FUNCTION: 11/17/22: Rt grip: *** #  11/10/22:  Rt grip: 97# now  10/18/22: Rt grip: 92#  Eval: Observed weakness in affected hand.  Grip strength Right: 99 lbs, Left: 106 lbs   SENSATION: 11/10/22: S2PD Rt Ulnar nerve: 5mm (4mm in Lt ulnar nerve)   10/18/22: Rt hand ulnar nerve distribution- 5mm S2-PT Discrimation  Eval:  Light touch diminished in Rt hand ulnar nerve distribution- 7mm S2-PT Discrimation in Ulnar nerve vs 4mm in Median nerve of Rt hand  (Lt hand had 4mm in both)   OBSERVATIONS:   11/17/22: Elbow flexion test now *** sec   Eval: 3 sec. positive elbow flexion test on the right, negative on the left.  Positive Tinel's at the cubital tunnel on the right, negative on the left.  Upper limb tension testing for the ulnar nerve was performed and showed exacerbation with neck and shoulder motion as well as elbow.  He presents like cubital tunnel syndrome double crush with tightness and impingement also at the Rt neck and shoulder.   TODAY'S TREATMENT:  11/17/22: *** Check all goals for progress note and determine any more need for therapy.   11/10/22: OT continues to review self-care/avoidance of nerve compression which she has a better handle on now.  OT reviews nerve glides with him as well as upgraded stretches for the wrist extension with bicep extension as well as upgraded PRE that was modified for different positions to avoid too much pressure on the pronator's were too much wrist extension causing additional nerve entrapment (modified PRE as listed below).  At the end of the session we also work on a very slow golf swing as this is a activity he would like to return to.  OT advises to do only very slow nonpainful motion as this can agitate the inner elbow where he has been having nerve entrapment.  He is swinging slowly several times and states that he would rather wait a few weeks to feel even better before attempting this activity.  Back rows: 55# x 5, some tingle, so reduced to 45# x 8 and reduced ROM  Tricep Press 45# x10   Modified chest press (limited wrist flexion) 35# x10  Biceps eccentric curls pronated and supinated with 35#   PATIENT EDUCATION: Education details: See tx section above for details  Person educated: Patient Education method: Engineer, structural, Teach back, Handouts  Education comprehension: States and demonstrates understanding, Additional Education required    HOME EXERCISE PROGRAM: Access Code: [email or web address] URL: [email or web address] Date: 10/04/2022 Prepared by: Fannie Knee   GOALS: Goals reviewed with patient? Yes   SHORT TERM GOALS: (STG required if POC>30 days) Target Date: 10/21/22  Pt will obtain protective, custom orthotic. Goal status: 10/11/22, NA- has night prefab ext brace now   2.  Pt will demo/state understanding of initial HEP to improve pain levels and prerequisite motion. Goal status: 10/11/22: MET   LONG  TERM GOALS: Target Date: 11/18/22  Pt will improve functional ability by decreased impairment per Quick DASH assessment from 20% to 10% or better, for better quality of life. Goal status: 11/17/22: ***   2.  Pt will improve A/ROM in Rt shoulder IR from 49* to at least 63*, to have functional motion for tasks like reach and grasp.  Goal status: 11/17/22: ***   4.  Pt will improve strength in Rt elbow flex/ext from painful, not tolerated to at least 4+/5 MMT to have increased functional ability to carry out selfcare and higher-level homecare tasks with no difficulty. Goal status: 11/17/22: ***   5.  Pt will decrease nerve pain at rest from 5/10 to 1/10 or better to have better sleep and occupational participation in daily roles. Goal status: 11/17/22: ***    ASSESSMENT:  CLINICAL IMPRESSION: 11/17/22: ***  11/10/22: As he is doing excellent after injection, his next visit which was scheduled for his last will likely be his last.  If he does not have exacerbation or significant issues we will do a progress note and likely discharge next  session.    PLAN:  PLANNED INTERVENTIONS: self care/ADL training, therapeutic exercise, therapeutic activity, neuromuscular re-education, manual therapy, splinting, compression bandaging, moist heat, cryotherapy, contrast bath, patient/family education, coping strategies training, and Dry needling  CONSULTED AND AGREED WITH PLAN OF CARE: Patient  PLAN FOR NEXT SESSION:   ***   Fannie Knee, OTR/L, CHT 11/10/2022, 2:26 PM  OCCUPATIONAL THERAPY DISCHARGE SUMMARY  Visits from Start of Care: ***  Current functional level related to goals / functional outcomes: Pt has met all goals to satisfactory levels and is pleased with outcomes. ***  Remaining deficits: Pt has no more significant functional deficits or pain. ***  Education / Equipment: Pt has all needed materials and education. Pt understands how to continue on with self-management. See tx notes for more details.   Patient agrees to discharge due to max benefits received from outpatient occupational therapy / hand therapy at this time.   Fannie Knee, OTR/L, CHT 11/17/22

## 2022-11-17 ENCOUNTER — Ambulatory Visit: Payer: 59 | Admitting: Rehabilitative and Restorative Service Providers"

## 2022-11-17 ENCOUNTER — Encounter: Payer: Self-pay | Admitting: Rehabilitative and Restorative Service Providers"

## 2022-11-17 DIAGNOSIS — M25521 Pain in right elbow: Secondary | ICD-10-CM | POA: Diagnosis not present

## 2022-11-17 DIAGNOSIS — M6281 Muscle weakness (generalized): Secondary | ICD-10-CM | POA: Diagnosis not present

## 2022-11-17 DIAGNOSIS — R202 Paresthesia of skin: Secondary | ICD-10-CM

## 2023-04-06 ENCOUNTER — Ambulatory Visit: Payer: 59 | Admitting: Nurse Practitioner

## 2023-04-06 VITALS — BP 110/66 | HR 83 | Temp 97.9°F | Wt 165.0 lb

## 2023-04-06 DIAGNOSIS — M542 Cervicalgia: Secondary | ICD-10-CM | POA: Diagnosis not present

## 2023-04-06 DIAGNOSIS — Z23 Encounter for immunization: Secondary | ICD-10-CM | POA: Diagnosis not present

## 2023-04-06 MED ORDER — CYCLOBENZAPRINE HCL 5 MG PO TABS
5.0000 mg | ORAL_TABLET | Freq: Three times a day (TID) | ORAL | 1 refills | Status: DC | PRN
Start: 2023-04-06 — End: 2023-10-04

## 2023-04-06 NOTE — Addendum Note (Signed)
Addended by: Cathleen Fears, Maizie Garno P on: 04/06/2023 05:01 PM   Modules accepted: Orders

## 2023-04-06 NOTE — Assessment & Plan Note (Signed)
Flu shot administered, VIS provided. 

## 2023-04-06 NOTE — Assessment & Plan Note (Signed)
Etiology unclear Appears consistent with musculoskeletal strain Treat with PRN tylenol and add cyclobenzaprine. 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 I think low likelihood of malignancy as there is no red flag/systemic symptoms but will order ultrasound of neck as his pain is quite localized. Further recommendations may be made based on labs and imaging studies.

## 2023-04-06 NOTE — Progress Notes (Addendum)
Established Patient Office Visit  Subjective   Patient ID: Ryan Horton, male    DOB: 04/25/1976  Age: 47 y.o. MRN: [id number]  Chief Complaint  Patient presents with   Neck Pain    Been going for about a month, right side of the neck, not sure what the cause. Dull pain, not constant, worse in the evenings     [name] is a 47 y/o male with noncontributory PMH.   He arrives for 1 month of right-sided anterior neck pain. No obvious triggers/trauma preceeding pain. No weight loss, fever, fatigue, cough, chest pain. No masses/nodules. Pain is dull, intermittent mild-moderate. Treated with OTC tylenol 500mg  with some improvement as needed.  Certain positions/stretches can trigger the pain. Patient is concerned about malignancy. Never smoker. Does drink liquor 1-2 shots 2-3x/week.     Review of Systems  Constitutional:  Negative for diaphoresis, fever, malaise/fatigue and weight loss.  Respiratory:  Negative for cough, shortness of breath and wheezing.   Musculoskeletal:  Positive for neck pain.  Psychiatric/Behavioral:  The patient does not have insomnia.       Objective:     BP 110/66   Pulse 83   Temp 97.9 F (36.6 C) (Temporal)   Wt 165 lb (74.8 kg)   SpO2 96%   BMI 23.68 kg/m    Physical Exam Vitals reviewed.  Constitutional:      Appearance: Normal appearance.  HENT:     Head: Normocephalic and atraumatic.  Neck:     Thyroid: No thyroid mass, thyromegaly or thyroid tenderness.  Cardiovascular:     Rate and Rhythm: Normal rate and regular rhythm.  Pulmonary:     Effort: Pulmonary effort is normal.     Breath sounds: Normal breath sounds.  Musculoskeletal:     Cervical back: Normal range of motion and neck supple. No edema, erythema, signs of trauma or bony tenderness. Pain with movement present. Normal range of motion.  Lymphadenopathy:     Cervical: No cervical adenopathy.  Skin:    General: Skin is warm and dry.  Neurological:     Mental Status:  He is alert and oriented to person, place, and time.  Psychiatric:        Mood and Affect: Mood normal.        Behavior: Behavior normal.        Thought Content: Thought content normal.        Judgment: Judgment normal.      No results found for any visits on 04/06/23.    The 10-year ASCVD risk score (Arnett DK, et al., 2019) is: 3.2%    Assessment & Plan:   Problem List Items Addressed This Visit       Other   Neck pain - Primary    Etiology unclear Appears consistent with musculoskeletal strain Treat with PRN tylenol and add cyclobenzaprine. 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 I think low likelihood of malignancy as there is no red flag/systemic symptoms but will order ultrasound of neck as his pain is quite localized. Further recommendations may be made based on labs and imaging studies.       Relevant Medications   cyclobenzaprine (FLEXERIL) 5 MG tablet   Other Relevant Orders   US Soft Tissue Head/Neck (NON-THYROID)   Basic metabolic panel   CBC   Needs flu shot    Flu shot administered, VIS  provided       Return in about 1 month (around 05/07/2023), or f/u with PCP.    Elenore Paddy, NP

## 2023-04-07 LAB — CBC
HCT: 45.9 % (ref 39.0–52.0)
Hemoglobin: 14.7 g/dL (ref 13.0–17.0)
MCHC: 32.1 g/dL (ref 30.0–36.0)
MCV: 87.1 fL (ref 78.0–100.0)
Platelets: 268 10*3/uL (ref 150.0–400.0)
RBC: 5.26 Mil/uL (ref 4.22–5.81)
RDW: 13.3 % (ref 11.5–15.5)
WBC: 7.3 10*3/uL (ref 4.0–10.5)

## 2023-04-07 LAB — BASIC METABOLIC PANEL
BUN: 16 mg/dL (ref 6–23)
CO2: 29 meq/L (ref 19–32)
Calcium: 10.1 mg/dL (ref 8.4–10.5)
Chloride: 103 meq/L (ref 96–112)
Creatinine, Ser: 1.19 mg/dL (ref 0.40–1.50)
GFR: 73.24 mL/min (ref >60.00)
Glucose, Bld: 84 mg/dL (ref 70–99)
Potassium: 4.4 meq/L (ref 3.5–5.1)
Sodium: 140 meq/L (ref 135–145)

## 2023-04-12 ENCOUNTER — Ambulatory Visit
Admission: RE | Admit: 2023-04-12 | Discharge: 2023-04-12 | Disposition: A | Discharge status: Home / Self Care | Payer: 59 | Source: Ambulatory Visit | Attending: Nurse Practitioner | Admitting: Nurse Practitioner

## 2023-04-12 DIAGNOSIS — M542 Cervicalgia: Secondary | ICD-10-CM

## 2023-05-18 ENCOUNTER — Ambulatory Visit: Payer: 59 | Admitting: Internal Medicine

## 2023-08-26 LAB — AMB RESULTS CONSOLE CBG: Glucose: 96

## 2023-08-28 ENCOUNTER — Encounter: Payer: Self-pay | Admitting: Emergency Medicine

## 2023-08-28 ENCOUNTER — Ambulatory Visit: Payer: 59 | Admitting: Emergency Medicine

## 2023-08-28 ENCOUNTER — Ambulatory Visit: Payer: Self-pay | Admitting: Internal Medicine

## 2023-08-28 VITALS — BP 134/90 | HR 73 | Temp 98.3°F | Ht 70.0 in | Wt 162.0 lb

## 2023-08-28 DIAGNOSIS — I10 Essential (primary) hypertension: Secondary | ICD-10-CM | POA: Diagnosis not present

## 2023-08-28 NOTE — Telephone Encounter (Signed)
 Copied from CRM [id number]. Topic: Clinical - Red Word Triage >> Aug 28, 2023 12:03 PM Ryan Horton wrote: Patient states since Saturday his blood pressure has been elevated 140/85 (Guesstimate). Patient also wants to schedule for his physical.   Chief Complaint: high BP Symptoms: high BP reading on Saturday: informed to seek medical attention on Monday Frequency: Saturday Pertinent Negatives: Patient denies chest pain, blurred vision, headache Disposition: [] ED /[] Urgent Care (no appt availability in office) / [x] Appointment(In office/virtual)/ []  [place] [facility]/ [] Home Care/ [] Refused Recommended Disposition /[] [place] [facility]/ []  Follow-up with PCP Additional Notes: Pt was informed BP high on Saturday at a wellness event: informed he needed to make appt on Monday to be seen.  Pt stated he did not remember actual BP reading. Pt currently not on BP medications. Pt stated in past x1 isolated high BP incident.  Reason for Disposition  [1] Systolic BP  >= 130 OR Diastolic >= 80 AND [2] taking BP medications  Answer Assessment - Initial Assessment Questions 1. BLOOD PRESSURE: "What is the blood pressure?" "Did you take at least two measurements 5 minutes apart?" Pt was informed BP high on Saturday at a wellness event: informed he needed to make appt on Monday to be seen  2. ONSET: "When did you take your blood pressure?"     Saturday 3. HOW: "How did you take your blood pressure?" (e.g., automatic home BP monitor, visiting nurse)     Automatic  4. HISTORY: "Do you have a history of high blood pressure?"      No: just x1 reading 5. MEDICINES: "Are you taking any medicines for blood pressure?" "Have you missed any doses recently?"     Not on blood pressure 6. OTHER SYMPTOMS: "Do you have any symptoms?" (e.g., blurred vision, chest pain, difficulty breathing, headache, weakness)     no 7. PREGNANCY: "Is there any chance you are pregnant?" "When was your last menstrual period?"      no  Protocols used: Blood Pressure - High-A-AH

## 2023-08-28 NOTE — Progress Notes (Signed)
 Ryan Horton 48 y.o.   Chief Complaint  Patient presents with   Hypertension    Patient states he did a B/P screening over the weekend and it was 150s. Patient doesn't hx of hypertension. No headaches and no swelling. He is having pain in lower back not sure if that could be the cause     HISTORY OF PRESENT ILLNESS: This is a 48 y.o. male concerned about elevated blood pressure readings over the last week No history of hypertension. Has been having pain to lower back which probably is contributing to pain  Hypertension Pertinent negatives include no chest pain, headaches, palpitations or shortness of breath.     Prior to Admission medications   Medication Sig Start Date End Date Taking? Authorizing Provider  cyclobenzaprine (FLEXERIL) 5 MG tablet Take 1 tablet (5 mg total) by mouth 3 (three) times daily as needed for muscle spasms. Patient not taking: Reported on 08/28/2023 04/06/23   Elenore Paddy, NP    Allergies  Allergen Reactions   Other     EKG electrodes-rash    Patient Active Problem List   Diagnosis Date Noted   Needs flu shot 04/06/2023   Carpal tunnel syndrome on right 09/29/2022   Need for hepatitis C screening test 09/27/2022   Screen for colon cancer 09/27/2022   Abnormal electrocardiogram (ECG) (EKG) 09/30/2019   Vitamin D deficiency 09/30/2019   Eczema 06/22/2017   Essential hypertension 05/11/2017   OSA (obstructive sleep apnea) 04/23/2014   Tinea versicolor 01/31/2013   Routine general medical examination at a health care facility 10/27/2010   Sarcoidosis 10/07/2010   OSGOOD SCHLATTERS DISEASE 08/31/2006    History reviewed. No pertinent past medical history.  Past Surgical History:  Procedure Laterality Date   KNEE ARTHROSCOPY  2011   right   VASECTOMY  06/12/2009    Social History   Socioeconomic History   Marital status: Married    Spouse name: Not on file   Number of children: Not on file   Years of education: Not on file    Highest education level: Not on file  Occupational History   Occupation: Copywriter, advertising at [employer]  Tobacco Use   Smoking status: Never    Passive exposure: Never   Smokeless tobacco: Never  Substance and Sexual Activity   Alcohol use: Yes    Alcohol/week: 2.0 standard drinks of alcohol    Types: 2 Cans of beer per week   Drug use: No   Sexual activity: Yes  Other Topics Concern   Not on file  Social History Narrative   Used to work with [employer]   Social Drivers of Health   Financial Resource Strain: Not on file  Food Insecurity: Patient Declined (08/28/2023)   Hunger Vital Sign    Worried About Running Out of Food in the Last Year: Patient declined    Ran Out of Food in the Last Year: Patient declined  Transportation Needs: Patient Declined (08/28/2023)   PRAPARE - Administrator, Civil Service (Medical): Patient declined    Lack of Transportation (Non-Medical): Patient declined  Physical Activity: Not on file  Stress: Not on file  Social Connections: Not on file  Intimate Partner Violence: Patient Declined (08/28/2023)   Humiliation, Afraid, Rape, and Kick questionnaire    Fear of Current or Ex-Partner: Patient declined    Emotionally Abused: Patient declined    Physically Abused: Patient declined    Sexually Abused: Patient declined  Family History  Problem Relation Age of Onset   Arthritis Father      Review of Systems  Constitutional: Negative.  Negative for chills and fever.  HENT: Negative.  Negative for congestion and sore throat.   Respiratory: Negative.  Negative for cough and shortness of breath.   Cardiovascular: Negative.  Negative for chest pain and palpitations.  Gastrointestinal:  Negative for abdominal pain, diarrhea, nausea and vomiting.  Genitourinary: Negative.  Negative for dysuria and hematuria.  Skin: Negative.  Negative for rash.  Neurological: Negative.  Negative for dizziness and headaches.  All other systems reviewed  and are negative.   Today's Vitals   08/28/23 1318  BP: (!) 134/90  Pulse: 73  Temp: 98.3 F (36.8 C)  TempSrc: Oral  SpO2: 96%  Weight: 162 lb (73.5 kg)  Height: 5\' 10"  (1.778 m)   Body mass index is 23.24 kg/m.   Physical Exam Vitals reviewed.  Constitutional:      Appearance: Normal appearance.  HENT:     Head: Normocephalic.  Eyes:     Extraocular Movements: Extraocular movements intact.  Cardiovascular:     Rate and Rhythm: Normal rate and regular rhythm.     Pulses: Normal pulses.     Heart sounds: Normal heart sounds.  Pulmonary:     Effort: Pulmonary effort is normal.     Breath sounds: Normal breath sounds.  Skin:    General: Skin is warm and dry.  Neurological:     Mental Status: He is alert and oriented to person, place, and time.  Psychiatric:        Behavior: Behavior normal.      ASSESSMENT & PLAN: A total of 42 minutes was spent with the patient and counseling/coordination of care regarding preparing for this visit, review of most recent office visit notes, diagnosis of hypertension and cardiovascular risks associated with this condition, need to monitor blood pressure readings at home daily for the next couple of weeks and keep a log, review of most recent bloodwork results, review of health maintenance items, education on nutrition, prognosis, documentation, and need for follow up.   Problem List Items Addressed This Visit       Cardiovascular and Mediastinum   Essential hypertension - Primary   Clinically stable.  Asymptomatic. Elevated blood pressure reading in the office and over the weekend Cardiovascular risk associated with hypertension discussed Benefits of exercise discussed Dietary approaches to control hypertension discussed Does not want to start medication at present time Advised to monitor blood pressure readings daily for the next couple weeks and keep a log.  Advised to contact the office/PCP if numbers persistently abnormal.       Patient Instructions  Hypertension, Adult High blood pressure (hypertension) is when the force of blood pumping through the arteries is too strong. The arteries are the blood vessels that carry blood from the heart throughout the body. Hypertension forces the heart to work harder to pump blood and may cause arteries to become narrow or stiff. Untreated or uncontrolled hypertension can lead to a heart attack, heart failure, a stroke, kidney disease, and other problems. A blood pressure reading consists of a higher number over a lower number. Ideally, your blood pressure should be below 120/80. The first ("top") number is called the systolic pressure. It is a measure of the pressure in your arteries as your heart beats. The second ("bottom") number is called the diastolic pressure. It is a measure of the pressure in your arteries as  the heart relaxes. What are the causes? The exact cause of this condition is not known. There are some conditions that result in high blood pressure. What increases the risk? Certain factors may make you more likely to develop high blood pressure. Some of these risk factors are under your control, including: Smoking. Not getting enough exercise or physical activity. Being overweight. Having too much fat, sugar, calories, or salt (sodium) in your diet. Drinking too much alcohol. Other risk factors include: Having a personal history of heart disease, diabetes, high cholesterol, or kidney disease. Stress. Having a family history of high blood pressure and high cholesterol. Having obstructive sleep apnea. Age. The risk increases with age. What are the signs or symptoms? High blood pressure may not cause symptoms. Very high blood pressure (hypertensive crisis) may cause: Headache. Fast or irregular heartbeats (palpitations). Shortness of breath. Nosebleed. Nausea and vomiting. Vision changes. Severe chest pain, dizziness, and seizures. How is this  diagnosed? This condition is diagnosed by measuring your blood pressure while you are seated, with your arm resting on a flat surface, your legs uncrossed, and your feet flat on the floor. The cuff of the blood pressure monitor will be placed directly against the skin of your upper arm at the level of your heart. Blood pressure should be measured at least twice using the same arm. Certain conditions can cause a difference in blood pressure between your right and left arms. If you have a high blood pressure reading during one visit or you have normal blood pressure with other risk factors, you may be asked to: Return on a different day to have your blood pressure checked again. Monitor your blood pressure at home for 1 week or longer. If you are diagnosed with hypertension, you may have other blood or imaging tests to help your health care provider understand your overall risk for other conditions. How is this treated? This condition is treated by making healthy lifestyle changes, such as eating healthy foods, exercising more, and reducing your alcohol intake. You may be referred for counseling on a healthy diet and physical activity. Your health care provider may prescribe medicine if lifestyle changes are not enough to get your blood pressure under control and if: Your systolic blood pressure is above 130. Your diastolic blood pressure is above 80. Your personal target blood pressure may vary depending on your medical conditions, your age, and other factors. Follow these instructions at home: Eating and drinking  Eat a diet that is high in fiber and potassium, and low in sodium, added sugar, and fat. An example of this eating plan is called the DASH diet. DASH stands for Dietary Approaches to Stop Hypertension. To eat this way: Eat plenty of fresh fruits and vegetables. Try to fill one half of your plate at each meal with fruits and vegetables. Eat whole grains, such as whole-wheat pasta, brown  rice, or whole-grain bread. Fill about one fourth of your plate with whole grains. Eat or drink low-fat dairy products, such as skim milk or low-fat yogurt. Avoid fatty cuts of meat, processed or cured meats, and poultry with skin. Fill about one fourth of your plate with lean proteins, such as fish, chicken without skin, beans, eggs, or tofu. Avoid pre-made and processed foods. These tend to be higher in sodium, added sugar, and fat. Reduce your daily sodium intake. Many people with hypertension should eat less than 1,500 mg of sodium a day. Do not drink alcohol if: Your health care provider tells you  not to drink. You are pregnant, may be pregnant, or are planning to become pregnant. If you drink alcohol: Limit how much you have to: 0-1 drink a day for women. 0-2 drinks a day for men. Know how much alcohol is in your drink. In the [place] one drink equals one 12 oz bottle of beer (355 mL), one 5 oz glass of wine (148 mL), or one 1 oz glass of hard liquor (44 mL). Lifestyle  Work with your health care provider to maintain a healthy body weight or to lose weight. Ask what an ideal weight is for you. Get at least 30 minutes of exercise that causes your heart to beat faster (aerobic exercise) most days of the week. Activities may include walking, swimming, or biking. Include exercise to strengthen your muscles (resistance exercise), such as Pilates or lifting weights, as part of your weekly exercise routine. Try to do these types of exercises for 30 minutes at least 3 days a week. Do not use any products that contain nicotine or tobacco. These products include cigarettes, chewing tobacco, and vaping devices, such as e-cigarettes. If you need help quitting, ask your health care provider. Monitor your blood pressure at home as told by your health care provider. Keep all follow-up visits. This is important. Medicines Take over-the-counter and prescription medicines only as told by your health care  provider. Follow directions carefully. Blood pressure medicines must be taken as prescribed. Do not skip doses of blood pressure medicine. Doing this puts you at risk for problems and can make the medicine less effective. Ask your health care provider about side effects or reactions to medicines that you should watch for. Contact a health care provider if you: Think you are having a reaction to a medicine you are taking. Have headaches that keep coming back (recurring). Feel dizzy. Have swelling in your ankles. Have trouble with your vision. Get help right away if you: Develop a severe headache or confusion. Have unusual weakness or numbness. Feel faint. Have severe pain in your chest or abdomen. Vomit repeatedly. Have trouble breathing. These symptoms may be an emergency. Get help right away. Call 911. Do not wait to see if the symptoms will go away. Do not drive yourself to the hospital. Summary Hypertension is when the force of blood pumping through your arteries is too strong. If this condition is not controlled, it may put you at risk for serious complications. Your personal target blood pressure may vary depending on your medical conditions, your age, and other factors. For most people, a normal blood pressure is less than 120/80. Hypertension is treated with lifestyle changes, medicines, or a combination of both. Lifestyle changes include losing weight, eating a healthy, low-sodium diet, exercising more, and limiting alcohol. This information is not intended to replace advice given to you by your health care provider. Make sure you discuss any questions you have with your health care provider. Document Revised: 04/27/2021 Document Reviewed: 04/27/2021 Elsevier Patient Education  2024 [employer]    Edwina Barth, MD [facility]

## 2023-08-28 NOTE — Assessment & Plan Note (Signed)
 Clinically stable.  Asymptomatic. Elevated blood pressure reading in the office and over the weekend Cardiovascular risk associated with hypertension discussed Benefits of exercise discussed Dietary approaches to control hypertension discussed Does not want to start medication at present time Advised to monitor blood pressure readings daily for the next couple weeks and keep a log.  Advised to contact the office/PCP if numbers persistently abnormal.

## 2023-08-28 NOTE — Progress Notes (Signed)
 Patient screened for HTN and non fasting random blood glucose. Blood pressure was found to be elevated after two readings. He denied CP, SOB. HA , dizziness and/or visual changes. Stated his BP usually is WNL. Educated him on healthy life style and stress reduction. Handouts provided. Encouraged him to keep a daily blood pressure dairy and to reach out to his PCP the following Monday and to report to ED/UC for any signs or symptoms of a cardiac event, sudden loss of sensation or movement on one side, severe HA. He stated understanding

## 2023-08-28 NOTE — Patient Instructions (Signed)
 Hypertension, Adult High blood pressure (hypertension) is when the force of blood pumping through the arteries is too strong. The arteries are the blood vessels that carry blood from the heart throughout the body. Hypertension forces the heart to work harder to pump blood and may cause arteries to become narrow or stiff. Untreated or uncontrolled hypertension can lead to a heart attack, heart failure, a stroke, kidney disease, and other problems. A blood pressure reading consists of a higher number over a lower number. Ideally, your blood pressure should be below 120/80. The first ("top") number is called the systolic pressure. It is a measure of the pressure in your arteries as your heart beats. The second ("bottom") number is called the diastolic pressure. It is a measure of the pressure in your arteries as the heart relaxes. What are the causes? The exact cause of this condition is not known. There are some conditions that result in high blood pressure. What increases the risk? Certain factors may make you more likely to develop high blood pressure. Some of these risk factors are under your control, including: Smoking. Not getting enough exercise or physical activity. Being overweight. Having too much fat, sugar, calories, or salt (sodium) in your diet. Drinking too much alcohol. Other risk factors include: Having a personal history of heart disease, diabetes, high cholesterol, or kidney disease. Stress. Having a family history of high blood pressure and high cholesterol. Having obstructive sleep apnea. Age. The risk increases with age. What are the signs or symptoms? High blood pressure may not cause symptoms. Very high blood pressure (hypertensive crisis) may cause: Headache. Fast or irregular heartbeats (palpitations). Shortness of breath. Nosebleed. Nausea and vomiting. Vision changes. Severe chest pain, dizziness, and seizures. How is this diagnosed? This condition is diagnosed by  measuring your blood pressure while you are seated, with your arm resting on a flat surface, your legs uncrossed, and your feet flat on the floor. The cuff of the blood pressure monitor will be placed directly against the skin of your upper arm at the level of your heart. Blood pressure should be measured at least twice using the same arm. Certain conditions can cause a difference in blood pressure between your right and left arms. If you have a high blood pressure reading during one visit or you have normal blood pressure with other risk factors, you may be asked to: Return on a different day to have your blood pressure checked again. Monitor your blood pressure at home for 1 week or longer. If you are diagnosed with hypertension, you may have other blood or imaging tests to help your health care provider understand your overall risk for other conditions. How is this treated? This condition is treated by making healthy lifestyle changes, such as eating healthy foods, exercising more, and reducing your alcohol intake. You may be referred for counseling on a healthy diet and physical activity. Your health care provider may prescribe medicine if lifestyle changes are not enough to get your blood pressure under control and if: Your systolic blood pressure is above 130. Your diastolic blood pressure is above 80. Your personal target blood pressure may vary depending on your medical conditions, your age, and other factors. Follow these instructions at home: Eating and drinking  Eat a diet that is high in fiber and potassium, and low in sodium, added sugar, and fat. An example of this eating plan is called the DASH diet. DASH stands for Dietary Approaches to Stop Hypertension. To eat this way: Eat  plenty of fresh fruits and vegetables. Try to fill one half of your plate at each meal with fruits and vegetables. Eat whole grains, such as whole-wheat pasta, brown rice, or whole-grain bread. Fill about one  fourth of your plate with whole grains. Eat or drink low-fat dairy products, such as skim milk or low-fat yogurt. Avoid fatty cuts of meat, processed or cured meats, and poultry with skin. Fill about one fourth of your plate with lean proteins, such as fish, chicken without skin, beans, eggs, or tofu. Avoid pre-made and processed foods. These tend to be higher in sodium, added sugar, and fat. Reduce your daily sodium intake. Many people with hypertension should eat less than 1,500 mg of sodium a day. Do not drink alcohol if: Your health care provider tells you not to drink. You are pregnant, may be pregnant, or are planning to become pregnant. If you drink alcohol: Limit how much you have to: 0-1 drink a day for women. 0-2 drinks a day for men. Know how much alcohol is in your drink. In the [place] one drink equals one 12 oz bottle of beer (355 mL), one 5 oz glass of wine (148 mL), or one 1 oz glass of hard liquor (44 mL). Lifestyle  Work with your health care provider to maintain a healthy body weight or to lose weight. Ask what an ideal weight is for you. Get at least 30 minutes of exercise that causes your heart to beat faster (aerobic exercise) most days of the week. Activities may include walking, swimming, or biking. Include exercise to strengthen your muscles (resistance exercise), such as Pilates or lifting weights, as part of your weekly exercise routine. Try to do these types of exercises for 30 minutes at least 3 days a week. Do not use any products that contain nicotine or tobacco. These products include cigarettes, chewing tobacco, and vaping devices, such as e-cigarettes. If you need help quitting, ask your health care provider. Monitor your blood pressure at home as told by your health care provider. Keep all follow-up visits. This is important. Medicines Take over-the-counter and prescription medicines only as told by your health care provider. Follow directions carefully. Blood  pressure medicines must be taken as prescribed. Do not skip doses of blood pressure medicine. Doing this puts you at risk for problems and can make the medicine less effective. Ask your health care provider about side effects or reactions to medicines that you should watch for. Contact a health care provider if you: Think you are having a reaction to a medicine you are taking. Have headaches that keep coming back (recurring). Feel dizzy. Have swelling in your ankles. Have trouble with your vision. Get help right away if you: Develop a severe headache or confusion. Have unusual weakness or numbness. Feel faint. Have severe pain in your chest or abdomen. Vomit repeatedly. Have trouble breathing. These symptoms may be an emergency. Get help right away. Call 911. Do not wait to see if the symptoms will go away. Do not drive yourself to the hospital. Summary Hypertension is when the force of blood pumping through your arteries is too strong. If this condition is not controlled, it may put you at risk for serious complications. Your personal target blood pressure may vary depending on your medical conditions, your age, and other factors. For most people, a normal blood pressure is less than 120/80. Hypertension is treated with lifestyle changes, medicines, or a combination of both. Lifestyle changes include losing weight, eating a healthy,  low-sodium diet, exercising more, and limiting alcohol. This information is not intended to replace advice given to you by your health care provider. Make sure you discuss any questions you have with your health care provider. Document Revised: 04/27/2021 Document Reviewed: 04/27/2021 Elsevier Patient Education  2024 [employer].

## 2023-10-04 ENCOUNTER — Encounter: Payer: Self-pay | Admitting: Internal Medicine

## 2023-10-04 ENCOUNTER — Ambulatory Visit (INDEPENDENT_AMBULATORY_CARE_PROVIDER_SITE_OTHER): Payer: 59 | Admitting: Internal Medicine

## 2023-10-04 VITALS — BP 132/92 | HR 75 | Temp 98.4°F | Resp 16 | Ht 70.0 in | Wt 167.0 lb

## 2023-10-04 DIAGNOSIS — D869 Sarcoidosis, unspecified: Secondary | ICD-10-CM

## 2023-10-04 DIAGNOSIS — I10 Essential (primary) hypertension: Secondary | ICD-10-CM

## 2023-10-04 DIAGNOSIS — E785 Hyperlipidemia, unspecified: Secondary | ICD-10-CM | POA: Diagnosis not present

## 2023-10-04 DIAGNOSIS — Z0000 Encounter for general adult medical examination without abnormal findings: Secondary | ICD-10-CM

## 2023-10-04 LAB — URINALYSIS, ROUTINE W REFLEX MICROSCOPIC
Bilirubin Urine: NEGATIVE
Hgb urine dipstick: NEGATIVE
Ketones, ur: NEGATIVE
Leukocytes,Ua: NEGATIVE
Nitrite: NEGATIVE
RBC / HPF: NONE SEEN (ref 0–?)
Specific Gravity, Urine: 1.01 (ref 1.000–1.030)
Total Protein, Urine: NEGATIVE
Urine Glucose: NEGATIVE
Urobilinogen, UA: 0.2 (ref 0.0–1.0)
pH: 6 (ref 5.0–8.0)

## 2023-10-04 LAB — HEPATIC FUNCTION PANEL
ALT: 15 U/L (ref 0–53)
AST: 21 U/L (ref 0–37)
Albumin: 4.8 g/dL (ref 3.5–5.2)
Alkaline Phosphatase: 52 U/L (ref 39–117)
Bilirubin, Direct: 0.2 mg/dL (ref 0.0–0.3)
Total Bilirubin: 1.2 mg/dL (ref 0.2–1.2)
Total Protein: 8 g/dL (ref 6.0–8.3)

## 2023-10-04 LAB — LIPID PANEL
Cholesterol: 202 mg/dL — ABNORMAL HIGH (ref 0–200)
HDL: 61.5 mg/dL (ref >39.00)
LDL Cholesterol: 132 mg/dL — ABNORMAL HIGH (ref 0–99)
NonHDL: 140.11
Total CHOL/HDL Ratio: 3
Triglycerides: 43 mg/dL (ref 0.0–149.0)
VLDL: 8.6 mg/dL (ref 0.0–40.0)

## 2023-10-04 LAB — TSH: TSH: 0.87 u[IU]/mL (ref 0.35–5.50)

## 2023-10-04 MED ORDER — AMLODIPINE BESYLATE 5 MG PO TABS: 5.0000 mg | ORAL_TABLET | Freq: Every day | ORAL | 1 refills | Status: DC

## 2023-10-04 NOTE — Progress Notes (Unsigned)
 Subjective:  Patient ID: Carolan Shiver, male    DOB: Mar 19, 1976  Age: 48 y.o. MRN: [id number]  CC: Annual Exam, Hypertension, and Hyperlipidemia   HPI Kerby Less II presents for a CPX and f/up -----  Discussed the use of AI scribe software for clinical note transcription with the patient, who gave verbal consent to proceed.  History of Present Illness   SADAO WEYER II "MJ" is a 48 year old male who presents for a routine check-up and blood pressure evaluation.  About a month ago, he experienced a high blood pressure reading during a community event screening. The initial reading was significantly elevated, but after resting, it decreased by about ten to fifteen points on either side, though it remained elevated. He attributes the high reading to having eaten soup and a grilled cheese sandwich shortly before the test and experiencing significant back pain at the time. He has not been treated for high blood pressure previously and is not currently taking any medications.  He feels good and stays active, primarily through work activities and occasionally walking two miles in the neighborhood with his wife once or twice a week. No chest pain, shortness of breath, dizziness, lightheadedness, or back pain during these activities. He denies any current back pain, stating that it has resolved.       Outpatient Medications Prior to Visit  Medication Sig Dispense Refill   cyclobenzaprine (FLEXERIL) 5 MG tablet Take 1 tablet (5 mg total) by mouth 3 (three) times daily as needed for muscle spasms. (Patient not taking: Reported on 10/04/2023) 30 tablet 1   No facility-administered medications prior to visit.    ROS Review of Systems  Objective:  BP (!) 132/92 (BP Location: Left Arm, Patient Position: Sitting, Cuff Size: Normal)   Pulse 75   Temp 98.4 F (36.9 C) (Oral)   Resp 16   Ht 5\' 10"  (1.778 m)   Wt 167 lb (75.8 kg)   SpO2 99%   BMI 23.96 kg/m   BP Readings from  Last 3 Encounters:  10/04/23 (!) 132/92  08/28/23 (!) 134/90  08/26/23 (!) 135/98    Wt Readings from Last 3 Encounters:  10/04/23 167 lb (75.8 kg)  08/28/23 162 lb (73.5 kg)  04/06/23 165 lb (74.8 kg)    Physical Exam Vitals reviewed. Exam conducted with a chaperone present.  Constitutional:      Appearance: Normal appearance.  HENT:     Mouth/Throat:     Mouth: Mucous membranes are moist.  Eyes:     General: No scleral icterus.    Conjunctiva/sclera: Conjunctivae normal.  Cardiovascular:     Rate and Rhythm: Normal rate and regular rhythm.     Heart sounds: No murmur heard.    No friction rub. No gallop.     Comments: EKG--- NSR, 68 bpm ST elevation c/w early repol  No LVH or Q waves  Unchanged  Pulmonary:     Effort: Pulmonary effort is normal.     Breath sounds: No stridor. No wheezing, rhonchi or rales.  Abdominal:     General: Abdomen is flat.     Palpations: There is no mass.     Tenderness: There is no abdominal tenderness. There is no guarding.     Hernia: No hernia is present. There is no hernia in the left inguinal area or right inguinal area.  Genitourinary:    Penis: Normal.      Testes: Normal.     Epididymis:  Right: Normal.     Left: Normal.     Prostate: Normal. Not enlarged, not tender and no nodules present.     Rectum: Normal. Guaiac result negative. No mass, tenderness, anal fissure, external hemorrhoid or internal hemorrhoid. Normal anal tone.  Musculoskeletal:        General: Normal range of motion.     Cervical back: Neck supple.     Right lower leg: No edema.     Left lower leg: No edema.  Lymphadenopathy:     Cervical: No cervical adenopathy.     Lower Body: No right inguinal adenopathy. No left inguinal adenopathy.  Skin:    General: Skin is warm and dry.  Neurological:     General: No focal deficit present.     Mental Status: Mental status is at baseline.  Psychiatric:        Mood and Affect: Mood normal.        Behavior:  Behavior normal.        Thought Content: Thought content normal.        Judgment: Judgment normal.     Lab Results  Component Value Date   WBC 7.3 04/07/2023   HGB 14.7 04/07/2023   HCT 45.9 04/07/2023   PLT 268.0 04/07/2023   GLUCOSE 84 04/07/2023   CHOL 202 (H) 10/04/2023   TRIG 43.0 10/04/2023   HDL 61.50 10/04/2023   LDLDIRECT 114.5 10/27/2010   LDLCALC 132 (H) 10/04/2023   ALT 15 10/04/2023   AST 21 10/04/2023   NA 140 04/07/2023   K 4.4 04/07/2023   CL 103 04/07/2023   CREATININE 1.19 04/07/2023   BUN 16 04/07/2023   CO2 29 04/07/2023   TSH 0.87 10/04/2023   PSA 1.01 09/27/2022    US Soft Tissue Head/Neck (NON-THYROID) Result Date: 04/23/2023 CLINICAL DATA:  Right neck pain for 1 month EXAM: ULTRASOUND OF HEAD/NECK SOFT TISSUES TECHNIQUE: Ultrasound examination of the head and neck soft tissues was performed in the area of clinical concern. COMPARISON:  None available FINDINGS: Targeted sonographic evaluation of the area of concern in the in the right neck demonstrates no discrete abnormality. IMPRESSION: No discrete sonographic abnormality identified in the region of concern in the right neck. Electronically Signed   By: Acquanetta Belling M.D.   On: 04/23/2023 09:50    Assessment & Plan:  Essential hypertension -     TSH; Future -     Hepatic function panel; Future -     Urinalysis, Routine w reflex microscopic; Future -     amLODIPine Besylate; Take 1 tablet (5 mg total) by mouth daily.  Dispense: 90 tablet; Refill: 1 -     Basic metabolic panel with GFR; Future  Dyslipidemia, goal LDL below 130 -     Lipid panel; Future -     TSH; Future -     Hepatic function panel; Future  Sarcoidosis -     Basic metabolic panel with GFR; Future  Routine general medical examination at a health care facility -     PSA; Future     Follow-up: Return in about 6 months (around 04/04/2024).  Sanda Linger, MD

## 2023-10-04 NOTE — Patient Instructions (Signed)
 Health Maintenance, Male  Adopting a healthy lifestyle and getting preventive care are important in promoting health and wellness. Ask your health care provider about:  The right schedule for you to have regular tests and exams.  Things you can do on your own to prevent diseases and keep yourself healthy.  What should I know about diet, weight, and exercise?  Eat a healthy diet    Eat a diet that includes plenty of vegetables, fruits, low-fat dairy products, and lean protein.  Do not eat a lot of foods that are high in solid fats, added sugars, or sodium.  Maintain a healthy weight  Body mass index (BMI) is a measurement that can be used to identify possible weight problems. It estimates body fat based on height and weight. Your health care provider can help determine your BMI and help you achieve or maintain a healthy weight.  Get regular exercise  Get regular exercise. This is one of the most important things you can do for your health. Most adults should:  Exercise for at least 150 minutes each week. The exercise should increase your heart rate and make you sweat (moderate-intensity exercise).  Do strengthening exercises at least twice a week. This is in addition to the moderate-intensity exercise.  Spend less time sitting. Even light physical activity can be beneficial.  Watch cholesterol and blood lipids  Have your blood tested for lipids and cholesterol at 47 years of age, then have this test every 5 years.  You may need to have your cholesterol levels checked more often if:  Your lipid or cholesterol levels are high.  You are older than 47 years of age.  You are at high risk for heart disease.  What should I know about cancer screening?  Many types of cancers can be detected early and may often be prevented. Depending on your health history and family history, you may need to have cancer screening at various ages. This may include screening for:  Colorectal cancer.  Prostate cancer.  Skin cancer.  Lung  cancer.  What should I know about heart disease, diabetes, and high blood pressure?  Blood pressure and heart disease  High blood pressure causes heart disease and increases the risk of stroke. This is more likely to develop in people who have high blood pressure readings or are overweight.  Talk with your health care provider about your target blood pressure readings.  Have your blood pressure checked:  Every 3-5 years if you are 9-95 years of age.  Every year if you are 85 years old or older.  If you are between the ages of 29 and 29 and are a current or former smoker, ask your health care provider if you should have a one-time screening for abdominal aortic aneurysm (AAA).  Diabetes  Have regular diabetes screenings. This checks your fasting blood sugar level. Have the screening done:  Once every three years after age 23 if you are at a normal weight and have a low risk for diabetes.  More often and at a younger age if you are overweight or have a high risk for diabetes.  What should I know about preventing infection?  Hepatitis B  If you have a higher risk for hepatitis B, you should be screened for this virus. Talk with your health care provider to find out if you are at risk for hepatitis B infection.  Hepatitis C  Blood testing is recommended for:  Everyone born from [birth year] through 1964.  Anyone  with known risk factors for hepatitis C.  Sexually transmitted infections (STIs)  You should be screened each year for STIs, including gonorrhea and chlamydia, if:  You are sexually active and are younger than 47 years of age.  You are older than 47 years of age and your health care provider tells you that you are at risk for this type of infection.  Your sexual activity has changed since you were last screened, and you are at increased risk for chlamydia or gonorrhea. Ask your health care provider if you are at risk.  Ask your health care provider about whether you are at high risk for HIV. Your health care provider  may recommend a prescription medicine to help prevent HIV infection. If you choose to take medicine to prevent HIV, you should first get tested for HIV. You should then be tested every 3 months for as long as you are taking the medicine.  Follow these instructions at home:  Alcohol use  Do not drink alcohol if your health care provider tells you not to drink.  If you drink alcohol:  Limit how much you have to 0-2 drinks a day.  Know how much alcohol is in your drink. In the [place] one drink equals one 12 oz bottle of beer (355 mL), one 5 oz glass of wine (148 mL), or one 1 oz glass of hard liquor (44 mL).  Lifestyle  Do not use any products that contain nicotine or tobacco. These products include cigarettes, chewing tobacco, and vaping devices, such as e-cigarettes. If you need help quitting, ask your health care provider.  Do not use street drugs.  Do not share needles.  Ask your health care provider for help if you need support or information about quitting drugs.  General instructions  Schedule regular health, dental, and eye exams.  Stay current with your vaccines.  Tell your health care provider if:  You often feel depressed.  You have ever been abused or do not feel safe at home.  Summary  Adopting a healthy lifestyle and getting preventive care are important in promoting health and wellness.  Follow your health care provider's instructions about healthy diet, exercising, and getting tested or screened for diseases.  Follow your health care provider's instructions on monitoring your cholesterol and blood pressure.  This information is not intended to replace advice given to you by your health care provider. Make sure you discuss any questions you have with your health care provider.  Document Revised: 11/09/2020 Document Reviewed: 11/09/2020  Elsevier Patient Education  2024 [employer].

## 2023-10-05 ENCOUNTER — Encounter: Payer: Self-pay | Admitting: Internal Medicine

## 2023-10-05 LAB — BASIC METABOLIC PANEL WITH GFR
BUN: 14 mg/dL (ref 6–23)
CO2: 26 meq/L (ref 19–32)
Calcium: 10.1 mg/dL (ref 8.4–10.5)
Chloride: 103 meq/L (ref 96–112)
Creatinine, Ser: 1.23 mg/dL (ref 0.40–1.50)
GFR: 70.15 mL/min (ref >60.00)
Glucose, Bld: 85 mg/dL (ref 70–99)
Potassium: 4.4 meq/L (ref 3.5–5.1)
Sodium: 140 meq/L (ref 135–145)

## 2023-10-05 NOTE — Addendum Note (Signed)
 Addended by: Daryll Brod on: 10/05/2023 04:23 PM   Modules accepted: Orders

## 2024-04-11 ENCOUNTER — Telehealth: Payer: Self-pay

## 2024-04-11 NOTE — Telephone Encounter (Signed)
 Copied from CRM [id number]. Topic: Appointments - Scheduling Inquiry for Clinic >> Apr 11, 2024 12:00 PM Tysheama G wrote: Reason for CRM: Patient stated he is moving 11/1 and his appointment is 11/4 but he needs something sooner than that, I did advised him I can put him on a waitlist but he needs something sooner before he moves and he only wants to see Dr.Jones since he knows his situation but he is also willing to see someone else if possible if he can get an appointment this month. Callback number [phone]

## 2024-04-12 NOTE — Telephone Encounter (Signed)
 Patients appointment has been rescheduled.

## 2024-04-22 ENCOUNTER — Ambulatory Visit: Admitting: Internal Medicine

## 2024-04-22 ENCOUNTER — Encounter: Payer: Self-pay | Admitting: Internal Medicine

## 2024-04-22 DIAGNOSIS — I10 Essential (primary) hypertension: Secondary | ICD-10-CM | POA: Diagnosis not present

## 2024-04-22 MED ORDER — COVID-19 MRNA VAC-TRIS(PFIZER) 30 MCG/0.3ML IM SUSY: 0.3000 mL | PREFILLED_SYRINGE | Freq: Once | INTRAMUSCULAR | 0 refills | Status: AC

## 2024-04-22 NOTE — Patient Instructions (Signed)
 Hypertension, Adult High blood pressure (hypertension) is when the force of blood pumping through the arteries is too strong. The arteries are the blood vessels that carry blood from the heart throughout the body. Hypertension forces the heart to work harder to pump blood and may cause arteries to become narrow or stiff. Untreated or uncontrolled hypertension can lead to a heart attack, heart failure, a stroke, kidney disease, and other problems. A blood pressure reading consists of a higher number over a lower number. Ideally, your blood pressure should be below 120/80. The first ("top") number is called the systolic pressure. It is a measure of the pressure in your arteries as your heart beats. The second ("bottom") number is called the diastolic pressure. It is a measure of the pressure in your arteries as the heart relaxes. What are the causes? The exact cause of this condition is not known. There are some conditions that result in high blood pressure. What increases the risk? Certain factors may make you more likely to develop high blood pressure. Some of these risk factors are under your control, including: Smoking. Not getting enough exercise or physical activity. Being overweight. Having too much fat, sugar, calories, or salt (sodium) in your diet. Drinking too much alcohol. Other risk factors include: Having a personal history of heart disease, diabetes, high cholesterol, or kidney disease. Stress. Having a family history of high blood pressure and high cholesterol. Having obstructive sleep apnea. Age. The risk increases with age. What are the signs or symptoms? High blood pressure may not cause symptoms. Very high blood pressure (hypertensive crisis) may cause: Headache. Fast or irregular heartbeats (palpitations). Shortness of breath. Nosebleed. Nausea and vomiting. Vision changes. Severe chest pain, dizziness, and seizures. How is this diagnosed? This condition is diagnosed by  measuring your blood pressure while you are seated, with your arm resting on a flat surface, your legs uncrossed, and your feet flat on the floor. The cuff of the blood pressure monitor will be placed directly against the skin of your upper arm at the level of your heart. Blood pressure should be measured at least twice using the same arm. Certain conditions can cause a difference in blood pressure between your right and left arms. If you have a high blood pressure reading during one visit or you have normal blood pressure with other risk factors, you may be asked to: Return on a different day to have your blood pressure checked again. Monitor your blood pressure at home for 1 week or longer. If you are diagnosed with hypertension, you may have other blood or imaging tests to help your health care provider understand your overall risk for other conditions. How is this treated? This condition is treated by making healthy lifestyle changes, such as eating healthy foods, exercising more, and reducing your alcohol intake. You may be referred for counseling on a healthy diet and physical activity. Your health care provider may prescribe medicine if lifestyle changes are not enough to get your blood pressure under control and if: Your systolic blood pressure is above 130. Your diastolic blood pressure is above 80. Your personal target blood pressure may vary depending on your medical conditions, your age, and other factors. Follow these instructions at home: Eating and drinking  Eat a diet that is high in fiber and potassium, and low in sodium, added sugar, and fat. An example of this eating plan is called the DASH diet. DASH stands for Dietary Approaches to Stop Hypertension. To eat this way: Eat  plenty of fresh fruits and vegetables. Try to fill one half of your plate at each meal with fruits and vegetables. Eat whole grains, such as whole-wheat pasta, brown rice, or whole-grain bread. Fill about one  fourth of your plate with whole grains. Eat or drink low-fat dairy products, such as skim milk or low-fat yogurt. Avoid fatty cuts of meat, processed or cured meats, and poultry with skin. Fill about one fourth of your plate with lean proteins, such as fish, chicken without skin, beans, eggs, or tofu. Avoid pre-made and processed foods. These tend to be higher in sodium, added sugar, and fat. Reduce your daily sodium intake. Many people with hypertension should eat less than 1,500 mg of sodium a day. Do not drink alcohol if: Your health care provider tells you not to drink. You are pregnant, may be pregnant, or are planning to become pregnant. If you drink alcohol: Limit how much you have to: 0-1 drink a day for women. 0-2 drinks a day for men. Know how much alcohol is in your drink. In the [place] one drink equals one 12 oz bottle of beer (355 mL), one 5 oz glass of wine (148 mL), or one 1 oz glass of hard liquor (44 mL). Lifestyle  Work with your health care provider to maintain a healthy body weight or to lose weight. Ask what an ideal weight is for you. Get at least 30 minutes of exercise that causes your heart to beat faster (aerobic exercise) most days of the week. Activities may include walking, swimming, or biking. Include exercise to strengthen your muscles (resistance exercise), such as Pilates or lifting weights, as part of your weekly exercise routine. Try to do these types of exercises for 30 minutes at least 3 days a week. Do not use any products that contain nicotine or tobacco. These products include cigarettes, chewing tobacco, and vaping devices, such as e-cigarettes. If you need help quitting, ask your health care provider. Monitor your blood pressure at home as told by your health care provider. Keep all follow-up visits. This is important. Medicines Take over-the-counter and prescription medicines only as told by your health care provider. Follow directions carefully. Blood  pressure medicines must be taken as prescribed. Do not skip doses of blood pressure medicine. Doing this puts you at risk for problems and can make the medicine less effective. Ask your health care provider about side effects or reactions to medicines that you should watch for. Contact a health care provider if you: Think you are having a reaction to a medicine you are taking. Have headaches that keep coming back (recurring). Feel dizzy. Have swelling in your ankles. Have trouble with your vision. Get help right away if you: Develop a severe headache or confusion. Have unusual weakness or numbness. Feel faint. Have severe pain in your chest or abdomen. Vomit repeatedly. Have trouble breathing. These symptoms may be an emergency. Get help right away. Call 911. Do not wait to see if the symptoms will go away. Do not drive yourself to the hospital. Summary Hypertension is when the force of blood pumping through your arteries is too strong. If this condition is not controlled, it may put you at risk for serious complications. Your personal target blood pressure may vary depending on your medical conditions, your age, and other factors. For most people, a normal blood pressure is less than 120/80. Hypertension is treated with lifestyle changes, medicines, or a combination of both. Lifestyle changes include losing weight, eating a healthy,  low-sodium diet, exercising more, and limiting alcohol. This information is not intended to replace advice given to you by your health care provider. Make sure you discuss any questions you have with your health care provider. Document Revised: 04/27/2021 Document Reviewed: 04/27/2021 Elsevier Patient Education  2024 [employer].

## 2024-04-22 NOTE — Progress Notes (Signed)
 Subjective:  Patient ID: Ryan Horton, male    DOB: 02/23/1976  Age: 48 y.o. MRN: [id number]  CC: Hypertension   HPI Ryan JINNY Arloa II presents for f/up ---  Discussed the use of AI scribe software for clinical note transcription with the patient, who gave verbal consent to proceed.  History of Present Illness Ryan Horton is a 48 year old male with hypertension who presents for a six-month follow-up.  He has been adherent to his prescribed blood pressure medication regimen, although he initially took it against his preference. Recently, he has been off the medication and has only checked his blood pressure once or twice, with results he cannot recall. He did not bring his blood pressure log to the appointment. No sensations of his blood pressure being too high or too low.  He has increased his physical activity, engaging in cardio and weight training three to four times a week. He feels fine during exercise, with good endurance and no chest pain or shortness of breath.  He has received a flu vaccine and had initial COVID-19 vaccinations when he first became available, but has not had a recent COVID-19 vaccine.  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      Outpatient Medications Prior to Visit  Medication Sig Dispense Refill   amLODipine  (NORVASC ) 5 MG tablet Take 1 tablet (5 mg total) by mouth daily. (Patient not taking: Reported on 04/22/2024) 90 tablet 1   No facility-administered medications prior to visit.    ROS Review of Systems  Constitutional:  Negative for diaphoresis and fatigue.  HENT: Negative.    Eyes: Negative.   Respiratory:  Negative for cough, chest tightness, shortness of breath and wheezing.   Cardiovascular:  Negative for chest pain, palpitations and leg swelling.  Gastrointestinal: Negative.  Negative for abdominal pain, constipation, diarrhea, nausea and  vomiting.  Endocrine: Negative.   Genitourinary: Negative.  Negative for difficulty urinating.  Musculoskeletal:  Negative for arthralgias, joint swelling and myalgias.  Skin: Negative.   Neurological:  Negative for dizziness, weakness and light-headedness.  Hematological:  Negative for adenopathy. Does not bruise/bleed easily.  Psychiatric/Behavioral: Negative.      Objective:  BP 120/80 (BP Location: Left Arm, Patient Position: Sitting, Cuff Size: Normal)   Pulse 76   Temp 98.4 F (36.9 C) (Oral)   Resp 16   Ht 5' 10 (1.778 m)   Wt 174 lb 3.2 oz (79 kg)   SpO2 96%   BMI 25.00 kg/m   BP Readings from Last 3 Encounters:  04/22/24 120/80  10/04/23 (!) 132/92  08/28/23 (!) 134/90    Wt Readings from Last 3 Encounters:  04/22/24 174 lb 3.2 oz (79 kg)  10/04/23 167 lb (75.8 kg)  08/28/23 162 lb (73.5 kg)    Physical Exam Vitals reviewed.  Constitutional:      Appearance: Normal appearance.  HENT:     Nose: Nose normal.     Mouth/Throat:     Mouth: Mucous membranes are moist.  Eyes:     General: No scleral icterus.    Conjunctiva/sclera: Conjunctivae normal.  Cardiovascular:     Rate and Rhythm: Normal rate and regular rhythm.     Heart sounds: No murmur heard.    No friction rub. No gallop.  Pulmonary:     Effort: Pulmonary effort is normal.     Breath sounds:  No stridor. No wheezing, rhonchi or rales.  Abdominal:     General: Abdomen is flat.     Palpations: There is no mass.     Tenderness: There is no abdominal tenderness. There is no guarding.     Hernia: No hernia is present.  Musculoskeletal:        General: Normal range of motion.     Cervical back: Neck supple.     Right lower leg: No edema.     Left lower leg: No edema.  Lymphadenopathy:     Cervical: No cervical adenopathy.  Skin:    General: Skin is warm and dry.  Neurological:     General: No focal deficit present.     Mental Status: He is alert.  Psychiatric:        Mood and Affect: Mood  normal.        Behavior: Behavior normal.     Lab Results  Component Value Date   WBC 7.3 04/07/2023   HGB 14.7 04/07/2023   HCT 45.9 04/07/2023   PLT 268.0 04/07/2023   GLUCOSE 85 10/04/2023   CHOL 202 (H) 10/04/2023   TRIG 43.0 10/04/2023   HDL 61.50 10/04/2023   LDLDIRECT 114.5 10/27/2010   LDLCALC 132 (H) 10/04/2023   ALT 15 10/04/2023   AST 21 10/04/2023   NA 140 10/04/2023   K 4.4 10/04/2023   CL 103 10/04/2023   CREATININE 1.23 10/04/2023   BUN 14 10/04/2023   CO2 26 10/04/2023   TSH 0.87 10/04/2023   PSA 1.01 09/27/2022    US  Soft Tissue Head/Neck (NON-THYROID ) Result Date: 04/23/2023 CLINICAL DATA:  Right neck pain for 1 month EXAM: ULTRASOUND OF HEAD/NECK SOFT TISSUES TECHNIQUE: Ultrasound examination of the head and neck soft tissues was performed in the area of clinical concern. COMPARISON:  None available FINDINGS: Targeted sonographic evaluation of the area of concern in the in the right neck demonstrates no discrete abnormality. IMPRESSION: No discrete sonographic abnormality identified in the region of concern in the right neck. Electronically Signed   By: Aliene Lloyd M.D.   On: 04/23/2023 09:50    Assessment & Plan:   Essential hypertension- BP is well controlled with lifestyle modifications. Will discontinue the CCB. -     COVID-19 mRNA Vac-TriS(Pfizer); Inject 0.3 mLs into the muscle once for 1 dose.  Dispense: 0.3 mL; Refill: 0     Follow-up: Return in about 6 months (around 10/21/2024).  Debby Molt, MD

## 2024-05-07 ENCOUNTER — Ambulatory Visit: Admitting: Internal Medicine
